# Patient Record
Sex: Male | Born: 1935
Health system: Southern US, Community
[De-identification: ages and names within clinical notes are randomized; demographics above are authoritative.]

## PROBLEM LIST (undated history)

## (undated) DIAGNOSIS — K219 Gastro-esophageal reflux disease without esophagitis: Secondary | ICD-10-CM

## (undated) DIAGNOSIS — N508 Other specified disorders of male genital organs: Secondary | ICD-10-CM

## (undated) DIAGNOSIS — M79609 Pain in unspecified limb: Secondary | ICD-10-CM

## (undated) DIAGNOSIS — I4891 Unspecified atrial fibrillation: Secondary | ICD-10-CM

## (undated) DIAGNOSIS — M549 Dorsalgia, unspecified: Secondary | ICD-10-CM

## (undated) DIAGNOSIS — I2510 Atherosclerotic heart disease of native coronary artery without angina pectoris: Secondary | ICD-10-CM

## (undated) DIAGNOSIS — S52509A Unspecified fracture of the lower end of unspecified radius, initial encounter for closed fracture: Secondary | ICD-10-CM

## (undated) DIAGNOSIS — M1990 Unspecified osteoarthritis, unspecified site: Secondary | ICD-10-CM

## (undated) DIAGNOSIS — L600 Ingrowing nail: Secondary | ICD-10-CM

## (undated) DIAGNOSIS — M25539 Pain in unspecified wrist: Secondary | ICD-10-CM

## (undated) DIAGNOSIS — R5381 Other malaise: Secondary | ICD-10-CM

## (undated) HISTORY — PX: OTHER SURGICAL HISTORY: SHX169

## (undated) HISTORY — DX: Unspecified atrial fibrillation: I48.91

## (undated) HISTORY — DX: Atherosclerotic heart disease of native coronary artery without angina pectoris: I25.10

## (undated) HISTORY — DX: Unspecified osteoarthritis, unspecified site: M19.90

## (undated) HISTORY — PX: POLYPECTOMY: SHX149

## (undated) HISTORY — DX: Dorsalgia, unspecified: M54.9

## (undated) HISTORY — PX: CATARACT EXTRACTION: SUR2

## (undated) HISTORY — DX: Gastro-esophageal reflux disease without esophagitis: K21.9

## (undated) HISTORY — PX: UMBILICAL HERNIA REPAIR: SHX196

## (undated) HISTORY — PX: CARDIAC CATHETERIZATION: SHX172

## (undated) HISTORY — PX: CHOLECYSTECTOMY: SHX55

## (undated) HISTORY — PX: MENISCUS REPAIR: SHX5179

---
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
## 1997-12-25 ENCOUNTER — Ambulatory Visit (HOSPITAL_COMMUNITY): Admission: RE | Admit: 1997-12-25 | Discharge: 1997-12-25 | Payer: Self-pay | Admitting: *Deleted

## 1998-01-14 ENCOUNTER — Other Ambulatory Visit: Admission: RE | Admit: 1998-01-14 | Discharge: 1998-01-14 | Payer: Self-pay | Admitting: Gastroenterology

## 2003-11-21 ENCOUNTER — Ambulatory Visit: Payer: Self-pay | Admitting: Internal Medicine

## 2003-12-11 ENCOUNTER — Ambulatory Visit: Payer: Self-pay | Admitting: Internal Medicine

## 2004-12-17 ENCOUNTER — Ambulatory Visit: Payer: Self-pay | Admitting: Internal Medicine

## 2004-12-22 ENCOUNTER — Ambulatory Visit: Payer: Self-pay | Admitting: Internal Medicine

## 2005-06-09 ENCOUNTER — Ambulatory Visit: Payer: Self-pay | Admitting: Gastroenterology

## 2005-06-17 ENCOUNTER — Ambulatory Visit: Payer: Self-pay | Admitting: Gastroenterology

## 2006-02-10 ENCOUNTER — Ambulatory Visit: Payer: Self-pay | Admitting: Internal Medicine

## 2006-02-10 LAB — CONVERTED CEMR LAB
Alkaline Phosphatase: 126 units/L — ABNORMAL HIGH (ref 39–117)
BUN: 15 mg/dL (ref 6–23)
CO2: 32 meq/L (ref 19–32)
Calcium: 9.5 mg/dL (ref 8.4–10.5)
Chloride: 103 meq/L (ref 96–112)
Cholesterol: 194 mg/dL (ref 0–200)
GFR calc non Af Amer: 70 mL/min
Glucose, Bld: 114 mg/dL — ABNORMAL HIGH (ref 70–99)
HDL: 42.3 mg/dL (ref >39.0)
LDL Cholesterol: 130 mg/dL — ABNORMAL HIGH (ref 0–99)
PSA: 1.34 ng/mL (ref 0.10–4.00)
Potassium: 4.5 meq/L (ref 3.5–5.1)
Total CHOL/HDL Ratio: 4.6
Total Protein: 6.9 g/dL (ref 6.0–8.3)

## 2006-11-10 ENCOUNTER — Encounter: Payer: Self-pay | Admitting: Internal Medicine

## 2006-11-10 ENCOUNTER — Ambulatory Visit: Payer: Self-pay | Admitting: Internal Medicine

## 2006-11-15 ENCOUNTER — Encounter: Payer: Self-pay | Admitting: Internal Medicine

## 2006-11-15 ENCOUNTER — Ambulatory Visit: Payer: Self-pay | Admitting: Internal Medicine

## 2006-11-15 LAB — CONVERTED CEMR LAB
Basophils Relative: 0.2 % (ref 0.0–1.0)
CRP, High Sensitivity: 31.9 — ABNORMAL HIGH
Eosinophils Relative: 1.6 % (ref 0.0–5.0)
Hemoglobin: 13.6 g/dL (ref 13.0–17.0)
Lymphocytes Relative: 33.2 % (ref 12.0–46.0)
Monocytes Relative: 5.6 % (ref 3.0–11.0)
Platelets: 256 10*3/uL (ref 150–400)
RDW: 12.3 % (ref 11.5–14.6)
WBC: 4.6 10*3/uL (ref 4.5–10.5)

## 2006-11-18 ENCOUNTER — Telehealth: Payer: Self-pay | Admitting: Internal Medicine

## 2006-12-16 ENCOUNTER — Encounter: Payer: Self-pay | Admitting: Internal Medicine

## 2006-12-19 ENCOUNTER — Ambulatory Visit: Payer: Self-pay | Admitting: Internal Medicine

## 2006-12-19 DIAGNOSIS — N508 Other specified disorders of male genital organs: Secondary | ICD-10-CM

## 2006-12-19 HISTORY — DX: Other specified disorders of male genital organs: N50.8

## 2006-12-22 ENCOUNTER — Encounter: Payer: Self-pay | Admitting: Internal Medicine

## 2007-09-13 ENCOUNTER — Ambulatory Visit: Payer: Self-pay | Admitting: Internal Medicine

## 2007-09-13 DIAGNOSIS — R5381 Other malaise: Secondary | ICD-10-CM

## 2007-09-13 DIAGNOSIS — R5383 Other fatigue: Secondary | ICD-10-CM

## 2007-09-13 HISTORY — DX: Other malaise: R53.81

## 2007-09-13 LAB — CONVERTED CEMR LAB
BUN: 11 mg/dL (ref 6–23)
Basophils Relative: 0.8 % (ref 0.0–3.0)
CO2: 33 meq/L — ABNORMAL HIGH (ref 19–32)
Chloride: 110 meq/L (ref 96–112)
Creatinine, Ser: 1 mg/dL (ref 0.4–1.5)
Eosinophils Absolute: 0.1 10*3/uL (ref 0.0–0.7)
Eosinophils Relative: 1.4 % (ref 0.0–5.0)
Folate: 19.6 ng/mL
Glucose, Bld: 109 mg/dL — ABNORMAL HIGH (ref 70–99)
HCT: 40.5 % (ref 39.0–52.0)
Hemoglobin: 14.4 g/dL (ref 13.0–17.0)
MCV: 97.5 fL (ref 78.0–100.0)
Monocytes Absolute: 0.2 10*3/uL (ref 0.1–1.0)
Monocytes Relative: 5.3 % (ref 3.0–12.0)
Neutro Abs: 1.9 10*3/uL (ref 1.4–7.7)
PSA: 2.82 ng/mL (ref 0.10–4.00)
RBC: 4.16 M/uL — ABNORMAL LOW (ref 4.22–5.81)
WBC: 3.9 10*3/uL — ABNORMAL LOW (ref 4.5–10.5)

## 2007-10-19 ENCOUNTER — Telehealth: Payer: Self-pay | Admitting: Internal Medicine

## 2007-10-24 ENCOUNTER — Ambulatory Visit: Payer: Self-pay | Admitting: Internal Medicine

## 2009-02-13 ENCOUNTER — Encounter: Payer: Self-pay | Admitting: Internal Medicine

## 2009-02-20 ENCOUNTER — Encounter: Payer: Self-pay | Admitting: Internal Medicine

## 2010-02-17 NOTE — Letter (Signed)
Summary: [facility]  [facility]   Imported By: Sherian Rein 03/04/2009 09:07:35  _____________________________________________________________________  External Attachment:    Type:   Image     Comment:   External Document

## 2010-06-05 NOTE — Assessment & Plan Note (Signed)
[facility]                           PRIMARY CARE OFFICE NOTE   JUPITER, KABIR                  MRN:          [id number]  DATE:02/10/2006                            DOB:          November 23, 1930    Mr. Lenz is a pleasant 77 year old gentleman who presents for  follow-up evaluation and exam.  He was last seen December 22, 2004.  Please see that complete dictation for past medical history and social  history.  Family history is noncontributory and is documented elsewhere.   INTERVAL HISTORY:  The patient reports that he fell off a ladder several  weeks ago after trying to clean the gutters.  He hit hard on his back  and hips.  He was seen by orthopedics several weeks after the fall  because of persistent pain and was diagnosed with a right inferior pubic  ramus fracture. He reports he is doing much better at this time but  still has some discomfort.   The patient continues to have occasional discomfort in the left lower  quadrant of his abdomen.   CURRENT MEDICATIONS:  None except for Viagra on a p.r.n. basis.   CHART REVIEW:  The patient's last colonoscopy Jun 17, 2005, with  external hemorrhoids, otherwise unremarkable.  The patient did have  cardiac catheterization in 1999, which was a study that was normal with  no signs of obstructive coronary disease.  The patient has had abdominal  ultrasound August 29, 2001, which showed cholelithiasis with two hepatic  cysts and otherwise was unremarkable.  The patient has been asymptomatic  and never came to surgery.   REVIEW OF SYSTEMS:  Negative for any constitutional problems.  The  patient has had an eye exam in about the last 12 months.  The patient is  edentulous with dentures with poor fit on the mandible.  CARDIOVASCULAR:  Unremarkable except for rare palpitations.  No respiratory complaints.  GI:  Notable for irregular bowel habit but no bloating, no gas, no  distention.  GU:   Significant for nocturia x1.  MUSCULOSKELETAL:  As  above.   PHYSICAL EXAMINATION:  VITAL SIGNS:  Temperature was 97.4, blood  pressure was 153/69, pulse 56, weight 171.6.  Height is 5 feet 11  inches.  GENERAL APPEARANCE:  This is a slender gentleman looking younger than  his stated chronologic age, in no acute distress.  HEENT:  Normocephalic, atraumatic.  EACs and TMs noted to have mild  cerumen but the TMs were normal.  Oropharynx:  The patient is  edentulous.  No buccal lesions were noted.  Posterior pharynx was clear.  Conjunctivae and sclerae were clear.  PERRLA, EOMI.  Funduscopic exam  was unremarkable.  NECK:  Supple without thyromegaly.  NODES:  No adenopathy was noted in the cervical or supraclavicular  regions.  CHEST:  No CVA tenderness.  LUNGS:  Clear to auscultation and percussion.  CARDIOVASCULAR:  2+ radial pulse, no JVD or carotid bruits.  He had a  quiet precordium and was regular rate and rhythm without murmurs, rubs  or gallops.  ABDOMEN:  Soft, no guarding, no rebound.  No organosplenomegaly  was  noted.  GENITALIA:  Normal male phallus, bilaterally descended testicles.  RECTAL:  Normal sphincter tone was noted.  Prostate was 3+ but smooth  with no nodules.  EXTREMITIES:  Without clubbing, cyanosis, edema or deformity.  He has a  normal gait, can bear weight.  NEUROLOGIC:  Nonfocal.  SKIN:  Clear.   DATA BASE:  A 12-lead electrocardiogram was unremarkable except for RSR  prime pattern suggestive of a mild ventricular conduction delay.  He has  T-wave inversion in V1 and V2, which is unchanged from previous study.  Laboratory revealed a PSA of 1.34.  Electrolytes were normal.  Blood  sugar was 114.  Kidney function normal with a creatinine of 1.1 and a  GFR of 70.  Liver functions were normal.  Cholesterol was 194,  triglycerides 111, HDL was 42.3, LDL was 130.   ASSESSMENT AND PLAN:  1. Orthopedic:  The patient is status post fracture of the inferior       pubic ramus.  He is currently improving and doing well.  2. Health maintenance:  The patient is current and up to date with      colorectal cancer screening.  PSA was normal and appropriate for an      enlarged prostate.  3. The patient's lipids are at the top end of normal with an LDL of      130, the goal would be 130 or less.  The patient is very active, at      this point would not make any changes in his regimen or recommend      medical therapy or treatment.  4. The patient's serum glucose is at the top end of normal at 114.      Would recommend that the patient follow a prudent diet in regard to      sweets and carbohydrates.   In summary, this is a very pleasant gentleman who does seem medically  stable at his time.  He is asked to return to see me in 1 year on an as-  needed basis.     Rosalyn Gess Norins, MD  Electronically Signed    MEN/MedQ  DD: 02/11/2006  DT: 02/11/2006  Job #: [id number]   cc:   Danae Orleans. Senaida Ores

## 2010-08-14 ENCOUNTER — Ambulatory Visit (INDEPENDENT_AMBULATORY_CARE_PROVIDER_SITE_OTHER): Payer: Medicare Other | Admitting: Ophthalmology

## 2010-08-14 DIAGNOSIS — H43819 Vitreous degeneration, unspecified eye: Secondary | ICD-10-CM

## 2010-08-14 DIAGNOSIS — H35039 Hypertensive retinopathy, unspecified eye: Secondary | ICD-10-CM

## 2010-08-14 DIAGNOSIS — H348192 Central retinal vein occlusion, unspecified eye, stable: Secondary | ICD-10-CM

## 2010-08-14 DIAGNOSIS — H2510 Age-related nuclear cataract, unspecified eye: Secondary | ICD-10-CM

## 2010-08-20 ENCOUNTER — Encounter (INDEPENDENT_AMBULATORY_CARE_PROVIDER_SITE_OTHER): Payer: Medicare Other | Admitting: Ophthalmology

## 2010-08-20 DIAGNOSIS — H348192 Central retinal vein occlusion, unspecified eye, stable: Secondary | ICD-10-CM

## 2011-02-25 ENCOUNTER — Ambulatory Visit (INDEPENDENT_AMBULATORY_CARE_PROVIDER_SITE_OTHER): Payer: Medicare Other | Admitting: Ophthalmology

## 2011-02-25 DIAGNOSIS — H2510 Age-related nuclear cataract, unspecified eye: Secondary | ICD-10-CM

## 2011-02-25 DIAGNOSIS — H348192 Central retinal vein occlusion, unspecified eye, stable: Secondary | ICD-10-CM

## 2011-02-25 DIAGNOSIS — I10 Essential (primary) hypertension: Secondary | ICD-10-CM

## 2011-02-25 DIAGNOSIS — H35039 Hypertensive retinopathy, unspecified eye: Secondary | ICD-10-CM

## 2011-02-25 DIAGNOSIS — H43819 Vitreous degeneration, unspecified eye: Secondary | ICD-10-CM

## 2011-02-25 DIAGNOSIS — H47239 Glaucomatous optic atrophy, unspecified eye: Secondary | ICD-10-CM

## 2011-08-26 ENCOUNTER — Ambulatory Visit (INDEPENDENT_AMBULATORY_CARE_PROVIDER_SITE_OTHER): Payer: Medicare Other | Admitting: Ophthalmology

## 2011-08-26 DIAGNOSIS — H43819 Vitreous degeneration, unspecified eye: Secondary | ICD-10-CM

## 2011-08-26 DIAGNOSIS — I10 Essential (primary) hypertension: Secondary | ICD-10-CM

## 2011-08-26 DIAGNOSIS — H35039 Hypertensive retinopathy, unspecified eye: Secondary | ICD-10-CM

## 2011-08-26 DIAGNOSIS — H2510 Age-related nuclear cataract, unspecified eye: Secondary | ICD-10-CM

## 2011-08-26 DIAGNOSIS — H3410 Central retinal artery occlusion, unspecified eye: Secondary | ICD-10-CM

## 2012-02-24 ENCOUNTER — Ambulatory Visit (INDEPENDENT_AMBULATORY_CARE_PROVIDER_SITE_OTHER): Payer: Medicare Other | Admitting: Ophthalmology

## 2012-02-24 DIAGNOSIS — H43819 Vitreous degeneration, unspecified eye: Secondary | ICD-10-CM

## 2012-02-24 DIAGNOSIS — H2510 Age-related nuclear cataract, unspecified eye: Secondary | ICD-10-CM

## 2012-02-24 DIAGNOSIS — H348192 Central retinal vein occlusion, unspecified eye, stable: Secondary | ICD-10-CM

## 2012-11-23 ENCOUNTER — Ambulatory Visit (INDEPENDENT_AMBULATORY_CARE_PROVIDER_SITE_OTHER): Payer: Medicare Other | Admitting: Ophthalmology

## 2012-11-23 DIAGNOSIS — H43819 Vitreous degeneration, unspecified eye: Secondary | ICD-10-CM

## 2012-11-23 DIAGNOSIS — H348192 Central retinal vein occlusion, unspecified eye, stable: Secondary | ICD-10-CM

## 2012-11-23 DIAGNOSIS — H2510 Age-related nuclear cataract, unspecified eye: Secondary | ICD-10-CM

## 2013-08-23 ENCOUNTER — Ambulatory Visit (INDEPENDENT_AMBULATORY_CARE_PROVIDER_SITE_OTHER): Payer: Medicare Other | Admitting: Ophthalmology

## 2013-08-23 DIAGNOSIS — H348192 Central retinal vein occlusion, unspecified eye, stable: Secondary | ICD-10-CM | POA: Diagnosis not present

## 2014-05-01 DIAGNOSIS — Z0000 Encounter for general adult medical examination without abnormal findings: Secondary | ICD-10-CM | POA: Diagnosis not present

## 2014-05-01 DIAGNOSIS — R001 Bradycardia, unspecified: Secondary | ICD-10-CM | POA: Diagnosis not present

## 2014-05-01 DIAGNOSIS — Z1389 Encounter for screening for other disorder: Secondary | ICD-10-CM | POA: Diagnosis not present

## 2014-05-01 DIAGNOSIS — Z6822 Body mass index (BMI) 22.0-22.9, adult: Secondary | ICD-10-CM | POA: Diagnosis not present

## 2014-05-01 DIAGNOSIS — E785 Hyperlipidemia, unspecified: Secondary | ICD-10-CM | POA: Diagnosis not present

## 2014-05-01 DIAGNOSIS — Z125 Encounter for screening for malignant neoplasm of prostate: Secondary | ICD-10-CM | POA: Diagnosis not present

## 2014-05-01 DIAGNOSIS — Z9181 History of falling: Secondary | ICD-10-CM | POA: Diagnosis not present

## 2014-05-29 ENCOUNTER — Ambulatory Visit (INDEPENDENT_AMBULATORY_CARE_PROVIDER_SITE_OTHER): Payer: Medicare Other | Admitting: Ophthalmology

## 2014-05-29 DIAGNOSIS — H43813 Vitreous degeneration, bilateral: Secondary | ICD-10-CM | POA: Diagnosis not present

## 2014-05-29 DIAGNOSIS — H34812 Central retinal vein occlusion, left eye: Secondary | ICD-10-CM | POA: Diagnosis not present

## 2014-10-24 DIAGNOSIS — Z23 Encounter for immunization: Secondary | ICD-10-CM | POA: Diagnosis not present

## 2015-01-21 DIAGNOSIS — Z6822 Body mass index (BMI) 22.0-22.9, adult: Secondary | ICD-10-CM | POA: Diagnosis not present

## 2015-01-21 DIAGNOSIS — Z23 Encounter for immunization: Secondary | ICD-10-CM | POA: Diagnosis not present

## 2015-01-21 DIAGNOSIS — R0781 Pleurodynia: Secondary | ICD-10-CM | POA: Diagnosis not present

## 2015-03-05 ENCOUNTER — Ambulatory Visit (INDEPENDENT_AMBULATORY_CARE_PROVIDER_SITE_OTHER): Payer: Medicare Other | Admitting: Ophthalmology

## 2015-03-05 DIAGNOSIS — H43813 Vitreous degeneration, bilateral: Secondary | ICD-10-CM

## 2015-03-05 DIAGNOSIS — H348122 Central retinal vein occlusion, left eye, stable: Secondary | ICD-10-CM

## 2015-03-05 DIAGNOSIS — H2513 Age-related nuclear cataract, bilateral: Secondary | ICD-10-CM | POA: Diagnosis not present

## 2015-03-05 DIAGNOSIS — H353131 Nonexudative age-related macular degeneration, bilateral, early dry stage: Secondary | ICD-10-CM

## 2015-05-08 DIAGNOSIS — Z1389 Encounter for screening for other disorder: Secondary | ICD-10-CM | POA: Diagnosis not present

## 2015-05-08 DIAGNOSIS — Z0000 Encounter for general adult medical examination without abnormal findings: Secondary | ICD-10-CM | POA: Diagnosis not present

## 2015-05-08 DIAGNOSIS — Z125 Encounter for screening for malignant neoplasm of prostate: Secondary | ICD-10-CM | POA: Diagnosis not present

## 2015-05-08 DIAGNOSIS — Z9181 History of falling: Secondary | ICD-10-CM | POA: Diagnosis not present

## 2015-05-08 DIAGNOSIS — R001 Bradycardia, unspecified: Secondary | ICD-10-CM | POA: Diagnosis not present

## 2015-05-08 DIAGNOSIS — E785 Hyperlipidemia, unspecified: Secondary | ICD-10-CM | POA: Diagnosis not present

## 2015-05-14 DIAGNOSIS — L720 Epidermal cyst: Secondary | ICD-10-CM | POA: Diagnosis not present

## 2015-05-14 DIAGNOSIS — L821 Other seborrheic keratosis: Secondary | ICD-10-CM | POA: Diagnosis not present

## 2015-05-14 DIAGNOSIS — C44311 Basal cell carcinoma of skin of nose: Secondary | ICD-10-CM | POA: Diagnosis not present

## 2015-06-04 DIAGNOSIS — C44311 Basal cell carcinoma of skin of nose: Secondary | ICD-10-CM | POA: Diagnosis not present

## 2015-07-24 DIAGNOSIS — M79609 Pain in unspecified limb: Secondary | ICD-10-CM | POA: Insufficient documentation

## 2015-07-24 DIAGNOSIS — L600 Ingrowing nail: Secondary | ICD-10-CM

## 2015-07-24 DIAGNOSIS — M79674 Pain in right toe(s): Secondary | ICD-10-CM | POA: Diagnosis not present

## 2015-07-24 HISTORY — DX: Pain in unspecified limb: M79.609

## 2015-07-24 HISTORY — DX: Ingrowing nail: L60.0

## 2015-08-08 DIAGNOSIS — H25813 Combined forms of age-related cataract, bilateral: Secondary | ICD-10-CM | POA: Diagnosis not present

## 2015-09-19 DIAGNOSIS — L578 Other skin changes due to chronic exposure to nonionizing radiation: Secondary | ICD-10-CM | POA: Diagnosis not present

## 2015-09-19 DIAGNOSIS — C44311 Basal cell carcinoma of skin of nose: Secondary | ICD-10-CM | POA: Diagnosis not present

## 2015-09-19 DIAGNOSIS — L821 Other seborrheic keratosis: Secondary | ICD-10-CM | POA: Diagnosis not present

## 2015-09-19 DIAGNOSIS — L820 Inflamed seborrheic keratosis: Secondary | ICD-10-CM | POA: Diagnosis not present

## 2015-09-19 DIAGNOSIS — D485 Neoplasm of uncertain behavior of skin: Secondary | ICD-10-CM | POA: Diagnosis not present

## 2015-11-07 DIAGNOSIS — Z23 Encounter for immunization: Secondary | ICD-10-CM | POA: Diagnosis not present

## 2015-12-03 ENCOUNTER — Ambulatory Visit (INDEPENDENT_AMBULATORY_CARE_PROVIDER_SITE_OTHER): Payer: Medicare Other | Admitting: Ophthalmology

## 2015-12-03 DIAGNOSIS — H353112 Nonexudative age-related macular degeneration, right eye, intermediate dry stage: Secondary | ICD-10-CM | POA: Diagnosis not present

## 2015-12-03 DIAGNOSIS — H34812 Central retinal vein occlusion, left eye, with macular edema: Secondary | ICD-10-CM | POA: Diagnosis not present

## 2015-12-03 DIAGNOSIS — H2513 Age-related nuclear cataract, bilateral: Secondary | ICD-10-CM | POA: Diagnosis not present

## 2015-12-03 DIAGNOSIS — H35033 Hypertensive retinopathy, bilateral: Secondary | ICD-10-CM | POA: Diagnosis not present

## 2015-12-03 DIAGNOSIS — I10 Essential (primary) hypertension: Secondary | ICD-10-CM | POA: Diagnosis not present

## 2015-12-03 DIAGNOSIS — H43813 Vitreous degeneration, bilateral: Secondary | ICD-10-CM | POA: Diagnosis not present

## 2015-12-03 DIAGNOSIS — H353121 Nonexudative age-related macular degeneration, left eye, early dry stage: Secondary | ICD-10-CM

## 2016-05-13 DIAGNOSIS — R7301 Impaired fasting glucose: Secondary | ICD-10-CM | POA: Diagnosis not present

## 2016-05-13 DIAGNOSIS — E785 Hyperlipidemia, unspecified: Secondary | ICD-10-CM | POA: Diagnosis not present

## 2016-05-13 DIAGNOSIS — Z125 Encounter for screening for malignant neoplasm of prostate: Secondary | ICD-10-CM | POA: Diagnosis not present

## 2016-05-13 DIAGNOSIS — Z1389 Encounter for screening for other disorder: Secondary | ICD-10-CM | POA: Diagnosis not present

## 2016-05-13 DIAGNOSIS — Z139 Encounter for screening, unspecified: Secondary | ICD-10-CM | POA: Diagnosis not present

## 2016-05-13 DIAGNOSIS — Z0000 Encounter for general adult medical examination without abnormal findings: Secondary | ICD-10-CM | POA: Diagnosis not present

## 2016-05-13 DIAGNOSIS — Z23 Encounter for immunization: Secondary | ICD-10-CM | POA: Diagnosis not present

## 2016-05-13 DIAGNOSIS — R001 Bradycardia, unspecified: Secondary | ICD-10-CM | POA: Diagnosis not present

## 2016-05-13 DIAGNOSIS — Z9181 History of falling: Secondary | ICD-10-CM | POA: Diagnosis not present

## 2016-07-08 DIAGNOSIS — Z125 Encounter for screening for malignant neoplasm of prostate: Secondary | ICD-10-CM | POA: Diagnosis not present

## 2016-07-08 DIAGNOSIS — E785 Hyperlipidemia, unspecified: Secondary | ICD-10-CM | POA: Diagnosis not present

## 2016-07-08 DIAGNOSIS — Z9181 History of falling: Secondary | ICD-10-CM | POA: Diagnosis not present

## 2016-07-08 DIAGNOSIS — Z139 Encounter for screening, unspecified: Secondary | ICD-10-CM | POA: Diagnosis not present

## 2016-07-08 DIAGNOSIS — Z0000 Encounter for general adult medical examination without abnormal findings: Secondary | ICD-10-CM | POA: Diagnosis not present

## 2016-07-08 DIAGNOSIS — Z1389 Encounter for screening for other disorder: Secondary | ICD-10-CM | POA: Diagnosis not present

## 2016-09-01 ENCOUNTER — Ambulatory Visit (INDEPENDENT_AMBULATORY_CARE_PROVIDER_SITE_OTHER): Payer: Medicare Other | Admitting: Ophthalmology

## 2016-09-01 DIAGNOSIS — H353132 Nonexudative age-related macular degeneration, bilateral, intermediate dry stage: Secondary | ICD-10-CM

## 2016-09-01 DIAGNOSIS — H348122 Central retinal vein occlusion, left eye, stable: Secondary | ICD-10-CM

## 2016-09-01 DIAGNOSIS — H43813 Vitreous degeneration, bilateral: Secondary | ICD-10-CM

## 2016-09-01 DIAGNOSIS — H2513 Age-related nuclear cataract, bilateral: Secondary | ICD-10-CM | POA: Diagnosis not present

## 2016-09-01 DIAGNOSIS — I10 Essential (primary) hypertension: Secondary | ICD-10-CM | POA: Diagnosis not present

## 2016-09-01 DIAGNOSIS — H35033 Hypertensive retinopathy, bilateral: Secondary | ICD-10-CM

## 2016-09-09 DIAGNOSIS — H25813 Combined forms of age-related cataract, bilateral: Secondary | ICD-10-CM | POA: Diagnosis not present

## 2016-09-24 DIAGNOSIS — H348122 Central retinal vein occlusion, left eye, stable: Secondary | ICD-10-CM | POA: Diagnosis not present

## 2016-09-24 DIAGNOSIS — H2513 Age-related nuclear cataract, bilateral: Secondary | ICD-10-CM | POA: Diagnosis not present

## 2016-09-24 DIAGNOSIS — H353131 Nonexudative age-related macular degeneration, bilateral, early dry stage: Secondary | ICD-10-CM | POA: Diagnosis not present

## 2016-10-11 DIAGNOSIS — H2512 Age-related nuclear cataract, left eye: Secondary | ICD-10-CM | POA: Diagnosis not present

## 2016-10-11 DIAGNOSIS — H2511 Age-related nuclear cataract, right eye: Secondary | ICD-10-CM | POA: Diagnosis not present

## 2016-10-11 DIAGNOSIS — H25811 Combined forms of age-related cataract, right eye: Secondary | ICD-10-CM | POA: Diagnosis not present

## 2016-10-16 DIAGNOSIS — H2512 Age-related nuclear cataract, left eye: Secondary | ICD-10-CM | POA: Diagnosis not present

## 2016-10-20 DIAGNOSIS — L578 Other skin changes due to chronic exposure to nonionizing radiation: Secondary | ICD-10-CM | POA: Diagnosis not present

## 2016-10-20 DIAGNOSIS — C44311 Basal cell carcinoma of skin of nose: Secondary | ICD-10-CM | POA: Diagnosis not present

## 2016-10-20 DIAGNOSIS — L821 Other seborrheic keratosis: Secondary | ICD-10-CM | POA: Diagnosis not present

## 2016-10-20 DIAGNOSIS — B351 Tinea unguium: Secondary | ICD-10-CM | POA: Diagnosis not present

## 2016-10-25 DIAGNOSIS — H2512 Age-related nuclear cataract, left eye: Secondary | ICD-10-CM | POA: Diagnosis not present

## 2016-10-26 DIAGNOSIS — H25812 Combined forms of age-related cataract, left eye: Secondary | ICD-10-CM | POA: Diagnosis not present

## 2016-11-05 DIAGNOSIS — Z23 Encounter for immunization: Secondary | ICD-10-CM | POA: Diagnosis not present

## 2016-11-25 DIAGNOSIS — H25812 Combined forms of age-related cataract, left eye: Secondary | ICD-10-CM | POA: Diagnosis not present

## 2016-12-17 ENCOUNTER — Encounter (INDEPENDENT_AMBULATORY_CARE_PROVIDER_SITE_OTHER): Payer: Medicare Other | Admitting: Ophthalmology

## 2016-12-17 DIAGNOSIS — H34812 Central retinal vein occlusion, left eye, with macular edema: Secondary | ICD-10-CM

## 2016-12-17 DIAGNOSIS — H43813 Vitreous degeneration, bilateral: Secondary | ICD-10-CM | POA: Diagnosis not present

## 2016-12-17 DIAGNOSIS — H35033 Hypertensive retinopathy, bilateral: Secondary | ICD-10-CM

## 2016-12-17 DIAGNOSIS — H353132 Nonexudative age-related macular degeneration, bilateral, intermediate dry stage: Secondary | ICD-10-CM

## 2016-12-17 DIAGNOSIS — I10 Essential (primary) hypertension: Secondary | ICD-10-CM

## 2016-12-28 DIAGNOSIS — S52501A Unspecified fracture of the lower end of right radius, initial encounter for closed fracture: Secondary | ICD-10-CM | POA: Diagnosis not present

## 2017-01-05 DIAGNOSIS — S52531D Colles' fracture of right radius, subsequent encounter for closed fracture with routine healing: Secondary | ICD-10-CM | POA: Diagnosis not present

## 2017-01-13 DIAGNOSIS — S52531D Colles' fracture of right radius, subsequent encounter for closed fracture with routine healing: Secondary | ICD-10-CM | POA: Diagnosis not present

## 2017-02-09 DIAGNOSIS — S52531D Colles' fracture of right radius, subsequent encounter for closed fracture with routine healing: Secondary | ICD-10-CM | POA: Diagnosis not present

## 2017-03-02 DIAGNOSIS — S52531D Colles' fracture of right radius, subsequent encounter for closed fracture with routine healing: Secondary | ICD-10-CM | POA: Diagnosis not present

## 2017-03-09 ENCOUNTER — Encounter (INDEPENDENT_AMBULATORY_CARE_PROVIDER_SITE_OTHER): Payer: Medicare Other | Admitting: Ophthalmology

## 2017-03-09 DIAGNOSIS — H34812 Central retinal vein occlusion, left eye, with macular edema: Secondary | ICD-10-CM | POA: Diagnosis not present

## 2017-03-09 DIAGNOSIS — H353132 Nonexudative age-related macular degeneration, bilateral, intermediate dry stage: Secondary | ICD-10-CM | POA: Diagnosis not present

## 2017-03-09 DIAGNOSIS — H43813 Vitreous degeneration, bilateral: Secondary | ICD-10-CM | POA: Diagnosis not present

## 2017-03-09 DIAGNOSIS — I10 Essential (primary) hypertension: Secondary | ICD-10-CM

## 2017-03-09 DIAGNOSIS — H35033 Hypertensive retinopathy, bilateral: Secondary | ICD-10-CM

## 2017-03-23 DIAGNOSIS — S52531D Colles' fracture of right radius, subsequent encounter for closed fracture with routine healing: Secondary | ICD-10-CM | POA: Diagnosis not present

## 2017-03-30 DIAGNOSIS — S52531D Colles' fracture of right radius, subsequent encounter for closed fracture with routine healing: Secondary | ICD-10-CM | POA: Diagnosis not present

## 2017-03-30 DIAGNOSIS — M6281 Muscle weakness (generalized): Secondary | ICD-10-CM | POA: Diagnosis not present

## 2017-03-30 DIAGNOSIS — M25631 Stiffness of right wrist, not elsewhere classified: Secondary | ICD-10-CM | POA: Diagnosis not present

## 2017-03-30 DIAGNOSIS — M25531 Pain in right wrist: Secondary | ICD-10-CM | POA: Diagnosis not present

## 2017-04-01 DIAGNOSIS — M25631 Stiffness of right wrist, not elsewhere classified: Secondary | ICD-10-CM | POA: Diagnosis not present

## 2017-04-01 DIAGNOSIS — M25531 Pain in right wrist: Secondary | ICD-10-CM | POA: Diagnosis not present

## 2017-04-01 DIAGNOSIS — M6281 Muscle weakness (generalized): Secondary | ICD-10-CM | POA: Diagnosis not present

## 2017-04-01 DIAGNOSIS — S52531D Colles' fracture of right radius, subsequent encounter for closed fracture with routine healing: Secondary | ICD-10-CM | POA: Diagnosis not present

## 2017-04-07 DIAGNOSIS — M25631 Stiffness of right wrist, not elsewhere classified: Secondary | ICD-10-CM | POA: Diagnosis not present

## 2017-04-07 DIAGNOSIS — M6281 Muscle weakness (generalized): Secondary | ICD-10-CM | POA: Diagnosis not present

## 2017-04-07 DIAGNOSIS — S52531D Colles' fracture of right radius, subsequent encounter for closed fracture with routine healing: Secondary | ICD-10-CM | POA: Diagnosis not present

## 2017-04-07 DIAGNOSIS — M25531 Pain in right wrist: Secondary | ICD-10-CM | POA: Diagnosis not present

## 2017-04-13 DIAGNOSIS — M25531 Pain in right wrist: Secondary | ICD-10-CM | POA: Diagnosis not present

## 2017-04-13 DIAGNOSIS — M25631 Stiffness of right wrist, not elsewhere classified: Secondary | ICD-10-CM | POA: Diagnosis not present

## 2017-04-13 DIAGNOSIS — M6281 Muscle weakness (generalized): Secondary | ICD-10-CM | POA: Diagnosis not present

## 2017-04-13 DIAGNOSIS — S52531D Colles' fracture of right radius, subsequent encounter for closed fracture with routine healing: Secondary | ICD-10-CM | POA: Diagnosis not present

## 2017-04-20 DIAGNOSIS — S52531D Colles' fracture of right radius, subsequent encounter for closed fracture with routine healing: Secondary | ICD-10-CM | POA: Diagnosis not present

## 2017-05-18 DIAGNOSIS — R7301 Impaired fasting glucose: Secondary | ICD-10-CM | POA: Diagnosis not present

## 2017-05-18 DIAGNOSIS — R001 Bradycardia, unspecified: Secondary | ICD-10-CM | POA: Diagnosis not present

## 2017-05-18 DIAGNOSIS — Z1331 Encounter for screening for depression: Secondary | ICD-10-CM | POA: Diagnosis not present

## 2017-05-18 DIAGNOSIS — Z0000 Encounter for general adult medical examination without abnormal findings: Secondary | ICD-10-CM | POA: Diagnosis not present

## 2017-05-18 DIAGNOSIS — Z125 Encounter for screening for malignant neoplasm of prostate: Secondary | ICD-10-CM | POA: Diagnosis not present

## 2017-05-18 DIAGNOSIS — E785 Hyperlipidemia, unspecified: Secondary | ICD-10-CM | POA: Diagnosis not present

## 2017-06-08 ENCOUNTER — Encounter (INDEPENDENT_AMBULATORY_CARE_PROVIDER_SITE_OTHER): Payer: Medicare Other | Admitting: Ophthalmology

## 2017-06-08 DIAGNOSIS — H353132 Nonexudative age-related macular degeneration, bilateral, intermediate dry stage: Secondary | ICD-10-CM

## 2017-06-08 DIAGNOSIS — I10 Essential (primary) hypertension: Secondary | ICD-10-CM

## 2017-06-08 DIAGNOSIS — H43813 Vitreous degeneration, bilateral: Secondary | ICD-10-CM | POA: Diagnosis not present

## 2017-06-08 DIAGNOSIS — H34812 Central retinal vein occlusion, left eye, with macular edema: Secondary | ICD-10-CM

## 2017-06-08 DIAGNOSIS — H35033 Hypertensive retinopathy, bilateral: Secondary | ICD-10-CM

## 2017-07-28 DIAGNOSIS — L600 Ingrowing nail: Secondary | ICD-10-CM | POA: Diagnosis not present

## 2017-09-07 ENCOUNTER — Encounter (INDEPENDENT_AMBULATORY_CARE_PROVIDER_SITE_OTHER): Payer: Self-pay | Admitting: Ophthalmology

## 2017-09-07 ENCOUNTER — Encounter (INDEPENDENT_AMBULATORY_CARE_PROVIDER_SITE_OTHER): Payer: Medicare Other | Admitting: Ophthalmology

## 2017-09-07 DIAGNOSIS — I10 Essential (primary) hypertension: Secondary | ICD-10-CM | POA: Diagnosis not present

## 2017-09-07 DIAGNOSIS — H353132 Nonexudative age-related macular degeneration, bilateral, intermediate dry stage: Secondary | ICD-10-CM

## 2017-09-07 DIAGNOSIS — H43813 Vitreous degeneration, bilateral: Secondary | ICD-10-CM | POA: Diagnosis not present

## 2017-09-07 DIAGNOSIS — H35033 Hypertensive retinopathy, bilateral: Secondary | ICD-10-CM | POA: Diagnosis not present

## 2017-09-07 DIAGNOSIS — H34812 Central retinal vein occlusion, left eye, with macular edema: Secondary | ICD-10-CM

## 2017-09-21 DIAGNOSIS — K5909 Other constipation: Secondary | ICD-10-CM | POA: Diagnosis not present

## 2017-09-28 DIAGNOSIS — M19231 Secondary osteoarthritis, right wrist: Secondary | ICD-10-CM | POA: Diagnosis not present

## 2017-09-28 DIAGNOSIS — S52531D Colles' fracture of right radius, subsequent encounter for closed fracture with routine healing: Secondary | ICD-10-CM | POA: Diagnosis not present

## 2017-10-06 DIAGNOSIS — K5900 Constipation, unspecified: Secondary | ICD-10-CM | POA: Diagnosis not present

## 2017-10-13 DIAGNOSIS — M19031 Primary osteoarthritis, right wrist: Secondary | ICD-10-CM | POA: Diagnosis not present

## 2017-10-13 DIAGNOSIS — M25531 Pain in right wrist: Secondary | ICD-10-CM | POA: Diagnosis not present

## 2017-10-13 DIAGNOSIS — S6991XA Unspecified injury of right wrist, hand and finger(s), initial encounter: Secondary | ICD-10-CM | POA: Diagnosis not present

## 2017-10-19 DIAGNOSIS — M25831 Other specified joint disorders, right wrist: Secondary | ICD-10-CM | POA: Diagnosis not present

## 2017-10-19 DIAGNOSIS — S52571P Other intraarticular fracture of lower end of right radius, subsequent encounter for closed fracture with malunion: Secondary | ICD-10-CM | POA: Diagnosis not present

## 2017-10-26 DIAGNOSIS — Z23 Encounter for immunization: Secondary | ICD-10-CM | POA: Diagnosis not present

## 2017-11-03 DIAGNOSIS — K5900 Constipation, unspecified: Secondary | ICD-10-CM | POA: Diagnosis not present

## 2017-11-12 DIAGNOSIS — S52509A Unspecified fracture of the lower end of unspecified radius, initial encounter for closed fracture: Secondary | ICD-10-CM

## 2017-11-12 DIAGNOSIS — S52501P Unspecified fracture of the lower end of right radius, subsequent encounter for closed fracture with malunion: Secondary | ICD-10-CM | POA: Diagnosis not present

## 2017-11-12 DIAGNOSIS — M25531 Pain in right wrist: Secondary | ICD-10-CM | POA: Diagnosis not present

## 2017-11-12 HISTORY — DX: Unspecified fracture of the lower end of unspecified radius, initial encounter for closed fracture: S52.509A

## 2017-12-07 ENCOUNTER — Encounter (INDEPENDENT_AMBULATORY_CARE_PROVIDER_SITE_OTHER): Payer: Medicare Other | Admitting: Ophthalmology

## 2017-12-07 DIAGNOSIS — H43813 Vitreous degeneration, bilateral: Secondary | ICD-10-CM

## 2017-12-07 DIAGNOSIS — H35033 Hypertensive retinopathy, bilateral: Secondary | ICD-10-CM

## 2017-12-07 DIAGNOSIS — I10 Essential (primary) hypertension: Secondary | ICD-10-CM

## 2017-12-07 DIAGNOSIS — H353132 Nonexudative age-related macular degeneration, bilateral, intermediate dry stage: Secondary | ICD-10-CM

## 2017-12-07 DIAGNOSIS — H34812 Central retinal vein occlusion, left eye, with macular edema: Secondary | ICD-10-CM | POA: Diagnosis not present

## 2017-12-13 DIAGNOSIS — K449 Diaphragmatic hernia without obstruction or gangrene: Secondary | ICD-10-CM | POA: Diagnosis not present

## 2017-12-13 DIAGNOSIS — D128 Benign neoplasm of rectum: Secondary | ICD-10-CM | POA: Diagnosis not present

## 2017-12-13 DIAGNOSIS — K2970 Gastritis, unspecified, without bleeding: Secondary | ICD-10-CM | POA: Diagnosis not present

## 2017-12-13 DIAGNOSIS — D122 Benign neoplasm of ascending colon: Secondary | ICD-10-CM | POA: Diagnosis not present

## 2017-12-13 DIAGNOSIS — K210 Gastro-esophageal reflux disease with esophagitis: Secondary | ICD-10-CM | POA: Diagnosis not present

## 2017-12-13 DIAGNOSIS — K222 Esophageal obstruction: Secondary | ICD-10-CM | POA: Diagnosis not present

## 2017-12-13 DIAGNOSIS — Z1211 Encounter for screening for malignant neoplasm of colon: Secondary | ICD-10-CM | POA: Diagnosis not present

## 2017-12-13 DIAGNOSIS — K5730 Diverticulosis of large intestine without perforation or abscess without bleeding: Secondary | ICD-10-CM | POA: Diagnosis not present

## 2017-12-13 DIAGNOSIS — D126 Benign neoplasm of colon, unspecified: Secondary | ICD-10-CM | POA: Diagnosis not present

## 2017-12-13 DIAGNOSIS — Z87891 Personal history of nicotine dependence: Secondary | ICD-10-CM | POA: Diagnosis not present

## 2017-12-13 DIAGNOSIS — K219 Gastro-esophageal reflux disease without esophagitis: Secondary | ICD-10-CM | POA: Diagnosis not present

## 2017-12-13 DIAGNOSIS — K621 Rectal polyp: Secondary | ICD-10-CM | POA: Diagnosis not present

## 2018-01-12 DIAGNOSIS — K219 Gastro-esophageal reflux disease without esophagitis: Secondary | ICD-10-CM | POA: Diagnosis not present

## 2018-02-15 ENCOUNTER — Encounter (INDEPENDENT_AMBULATORY_CARE_PROVIDER_SITE_OTHER): Payer: Medicare Other | Admitting: Ophthalmology

## 2018-02-16 ENCOUNTER — Encounter (INDEPENDENT_AMBULATORY_CARE_PROVIDER_SITE_OTHER): Payer: Medicare Other | Admitting: Ophthalmology

## 2018-02-16 DIAGNOSIS — H353132 Nonexudative age-related macular degeneration, bilateral, intermediate dry stage: Secondary | ICD-10-CM

## 2018-02-16 DIAGNOSIS — H43813 Vitreous degeneration, bilateral: Secondary | ICD-10-CM | POA: Diagnosis not present

## 2018-02-16 DIAGNOSIS — I10 Essential (primary) hypertension: Secondary | ICD-10-CM | POA: Diagnosis not present

## 2018-02-16 DIAGNOSIS — H34812 Central retinal vein occlusion, left eye, with macular edema: Secondary | ICD-10-CM

## 2018-02-16 DIAGNOSIS — H35033 Hypertensive retinopathy, bilateral: Secondary | ICD-10-CM

## 2018-03-02 DIAGNOSIS — R197 Diarrhea, unspecified: Secondary | ICD-10-CM | POA: Diagnosis not present

## 2018-03-30 DIAGNOSIS — L720 Epidermal cyst: Secondary | ICD-10-CM | POA: Diagnosis not present

## 2018-03-30 DIAGNOSIS — L821 Other seborrheic keratosis: Secondary | ICD-10-CM | POA: Diagnosis not present

## 2018-03-30 DIAGNOSIS — L578 Other skin changes due to chronic exposure to nonionizing radiation: Secondary | ICD-10-CM | POA: Diagnosis not present

## 2018-04-20 ENCOUNTER — Other Ambulatory Visit: Payer: Self-pay

## 2018-04-20 ENCOUNTER — Encounter (INDEPENDENT_AMBULATORY_CARE_PROVIDER_SITE_OTHER): Payer: Medicare Other | Admitting: Ophthalmology

## 2018-04-20 DIAGNOSIS — H353132 Nonexudative age-related macular degeneration, bilateral, intermediate dry stage: Secondary | ICD-10-CM

## 2018-04-20 DIAGNOSIS — I10 Essential (primary) hypertension: Secondary | ICD-10-CM | POA: Diagnosis not present

## 2018-04-20 DIAGNOSIS — H34812 Central retinal vein occlusion, left eye, with macular edema: Secondary | ICD-10-CM | POA: Diagnosis not present

## 2018-04-20 DIAGNOSIS — H43813 Vitreous degeneration, bilateral: Secondary | ICD-10-CM | POA: Diagnosis not present

## 2018-04-20 DIAGNOSIS — H35033 Hypertensive retinopathy, bilateral: Secondary | ICD-10-CM

## 2018-05-18 DIAGNOSIS — Z0000 Encounter for general adult medical examination without abnormal findings: Secondary | ICD-10-CM | POA: Diagnosis not present

## 2018-05-18 DIAGNOSIS — Z9181 History of falling: Secondary | ICD-10-CM | POA: Diagnosis not present

## 2018-05-18 DIAGNOSIS — E785 Hyperlipidemia, unspecified: Secondary | ICD-10-CM | POA: Diagnosis not present

## 2018-05-25 DIAGNOSIS — L570 Actinic keratosis: Secondary | ICD-10-CM | POA: Diagnosis not present

## 2018-06-01 DIAGNOSIS — Z139 Encounter for screening, unspecified: Secondary | ICD-10-CM | POA: Diagnosis not present

## 2018-06-01 DIAGNOSIS — E785 Hyperlipidemia, unspecified: Secondary | ICD-10-CM | POA: Diagnosis not present

## 2018-06-01 DIAGNOSIS — R001 Bradycardia, unspecified: Secondary | ICD-10-CM | POA: Diagnosis not present

## 2018-06-01 DIAGNOSIS — R21 Rash and other nonspecific skin eruption: Secondary | ICD-10-CM | POA: Diagnosis not present

## 2018-06-01 DIAGNOSIS — R7301 Impaired fasting glucose: Secondary | ICD-10-CM | POA: Diagnosis not present

## 2018-06-22 ENCOUNTER — Encounter (INDEPENDENT_AMBULATORY_CARE_PROVIDER_SITE_OTHER): Payer: Medicare Other | Admitting: Ophthalmology

## 2018-06-22 ENCOUNTER — Other Ambulatory Visit: Payer: Self-pay

## 2018-06-22 DIAGNOSIS — H35033 Hypertensive retinopathy, bilateral: Secondary | ICD-10-CM

## 2018-06-22 DIAGNOSIS — H43813 Vitreous degeneration, bilateral: Secondary | ICD-10-CM

## 2018-06-22 DIAGNOSIS — H353132 Nonexudative age-related macular degeneration, bilateral, intermediate dry stage: Secondary | ICD-10-CM

## 2018-06-22 DIAGNOSIS — I10 Essential (primary) hypertension: Secondary | ICD-10-CM

## 2018-06-22 DIAGNOSIS — H34812 Central retinal vein occlusion, left eye, with macular edema: Secondary | ICD-10-CM

## 2018-08-02 DIAGNOSIS — Z79899 Other long term (current) drug therapy: Secondary | ICD-10-CM | POA: Diagnosis not present

## 2018-08-02 DIAGNOSIS — I499 Cardiac arrhythmia, unspecified: Secondary | ICD-10-CM | POA: Diagnosis not present

## 2018-08-02 DIAGNOSIS — I340 Nonrheumatic mitral (valve) insufficiency: Secondary | ICD-10-CM | POA: Diagnosis not present

## 2018-08-02 DIAGNOSIS — R079 Chest pain, unspecified: Secondary | ICD-10-CM | POA: Diagnosis not present

## 2018-08-02 DIAGNOSIS — R002 Palpitations: Secondary | ICD-10-CM | POA: Diagnosis not present

## 2018-08-02 DIAGNOSIS — I361 Nonrheumatic tricuspid (valve) insufficiency: Secondary | ICD-10-CM | POA: Diagnosis not present

## 2018-08-02 DIAGNOSIS — K219 Gastro-esophageal reflux disease without esophagitis: Secondary | ICD-10-CM | POA: Diagnosis not present

## 2018-08-02 DIAGNOSIS — I4891 Unspecified atrial fibrillation: Secondary | ICD-10-CM | POA: Diagnosis not present

## 2018-08-02 DIAGNOSIS — M1990 Unspecified osteoarthritis, unspecified site: Secondary | ICD-10-CM | POA: Diagnosis not present

## 2018-08-03 DIAGNOSIS — I4891 Unspecified atrial fibrillation: Secondary | ICD-10-CM | POA: Diagnosis not present

## 2018-08-03 DIAGNOSIS — K219 Gastro-esophageal reflux disease without esophagitis: Secondary | ICD-10-CM | POA: Diagnosis not present

## 2018-08-03 DIAGNOSIS — R079 Chest pain, unspecified: Secondary | ICD-10-CM | POA: Diagnosis not present

## 2018-08-09 DIAGNOSIS — K219 Gastro-esophageal reflux disease without esophagitis: Secondary | ICD-10-CM | POA: Diagnosis not present

## 2018-08-09 DIAGNOSIS — I480 Paroxysmal atrial fibrillation: Secondary | ICD-10-CM | POA: Diagnosis not present

## 2018-08-21 ENCOUNTER — Encounter: Payer: Self-pay | Admitting: Cardiology

## 2018-08-21 ENCOUNTER — Ambulatory Visit (INDEPENDENT_AMBULATORY_CARE_PROVIDER_SITE_OTHER): Payer: Medicare Other | Admitting: Cardiology

## 2018-08-21 ENCOUNTER — Encounter: Payer: Self-pay | Admitting: *Deleted

## 2018-08-21 ENCOUNTER — Other Ambulatory Visit: Payer: Self-pay

## 2018-08-21 VITALS — BP 130/68 | HR 67 | Temp 98.2°F | Ht 72.0 in | Wt 169.0 lb

## 2018-08-21 DIAGNOSIS — M1990 Unspecified osteoarthritis, unspecified site: Secondary | ICD-10-CM

## 2018-08-21 DIAGNOSIS — K219 Gastro-esophageal reflux disease without esophagitis: Secondary | ICD-10-CM | POA: Diagnosis not present

## 2018-08-21 DIAGNOSIS — I2510 Atherosclerotic heart disease of native coronary artery without angina pectoris: Secondary | ICD-10-CM | POA: Diagnosis not present

## 2018-08-21 DIAGNOSIS — I4891 Unspecified atrial fibrillation: Secondary | ICD-10-CM

## 2018-08-21 DIAGNOSIS — M549 Dorsalgia, unspecified: Secondary | ICD-10-CM | POA: Insufficient documentation

## 2018-08-21 DIAGNOSIS — M25539 Pain in unspecified wrist: Secondary | ICD-10-CM

## 2018-08-21 HISTORY — DX: Pain in unspecified wrist: M25.539

## 2018-08-21 LAB — CBC
Hematocrit: 41.1 % (ref 37.5–51.0)
Hemoglobin: 13.6 g/dL (ref 13.0–17.7)
MCH: 33.3 pg — ABNORMAL HIGH (ref 26.6–33.0)
MCHC: 33.1 g/dL (ref 31.5–35.7)
MCV: 101 fL — ABNORMAL HIGH (ref 79–97)
Platelets: 197 10*3/uL (ref 150–450)
RBC: 4.08 x10E6/uL — ABNORMAL LOW (ref 4.14–5.80)
RDW: 13.4 % (ref 11.6–15.4)
WBC: 4.5 10*3/uL (ref 3.4–10.8)

## 2018-08-21 MED ORDER — DILTIAZEM HCL 30 MG PO TABS: 30.0000 mg | ORAL_TABLET | Freq: Two times a day (BID) | ORAL | 1 refills | Status: DC

## 2018-08-21 NOTE — Progress Notes (Signed)
Cardiology Office Note:    Date:  08/22/2018   ID:  Jason Mcconnell, DOB 01/06/1931, MRN [id number]  PCP:  Nicoletta Dress, MD  Cardiologist:  Shirlee More, MD   Referring MD: Nicoletta Dress, MD  ASSESSMENT:    1. Atrial fibrillation, unspecified type ([place])   2. Coronary artery calcification seen on CT scan   3. Gastroesophageal reflux disease without esophagitis   4. Arthritis    PLAN:    In order of problems listed above:  1. He presents with a single episode of paroxysmal atrial fibrillation.  I would put him on suppressive therapy and has not put him on a standing beta-blocker calcium channel blocker with resting heart rate in the range of 60 he will continue his anticoagulant and have a prescription for Cardizem to take PRN for rapid heart rhythm.  Apply a 7-day ZIO monitor if he is having frequent episodes of atrial fibrillation would benefit from an antiarrhythmic drug.  With the drop in hemoglobin in the hospital we will check a CBC today. 2. He is having no symptoms or signs of ischemia at this time I would not pursue an ischemia evaluation 3. Stable continue his PPI with anticoagulant therapy  Next appointment 6 weeks to review ZIO monitor   Medication Adjustments/Labs and Tests Ordered: Current medicines are reviewed at length with the patient today.  Concerns regarding medicines are outlined above.  Orders Placed This Encounter  Procedures  . CBC  . LONG TERM MONITOR (3-14 DAYS)  . EKG 12-Lead   Meds ordered this encounter  Medications  . diltiazem (CARDIZEM) 30 MG tablet    Sig: Take 1 tablet (30 mg total) by mouth 2 (two) times daily. For rapid heartrate    Dispense:  30 tablet    Refill:  1     Chief Complaint  Patient presents with  . Follow-up  . Atrial Fibrillation   History of Present Illness:    Jason Mcconnell is a 85 y.o. male who is being seen today for the evaluation of atrial fibrillation after recent admission at [facility].   He presented to [facility] on 08/02/18 with chest discomfort and palpitations admitted with new onset atrial fibrillation with RVR rate 140. He converted after 1 time dose of IV Cardizem in the ED, had CTA negative for PE after d-dimer 574, was started on Eliquis and subsequently discharged. CXR 08/02/18 with no acute findings, "apically architectural distortion, compatible with emphysema". CTA with "scattered left coronary artery calcifications, mild aortic atherosclerosis", occasional small pulmonary nodules up to 48mm recommended for CT follow up in 12 months. EKG at discharge shows sinus bradycardia. Labs during hospitalization were notable for TSH 2.47, Hb drop from 13.9 to 12.3 over 24 hours, negative troponin x2, LDL 82.   Since discharge she has had brief rapid heart rhythms not sustained.  He exercises on regular basis and has no exertional chest pain shortness of breath change in exercise tolerance and he has had no bleeding or syncope.  His resting heart rate runs in the range of 60 to 65 bpm.  He asked me if he needs to remain anticoagulated for life and I told him the answers probable and we negotiated stay on his anticoagulant 6 months as well as watch for evidence of recurrence.  With his brief episodes of rapid heart rhythm he will wear a 7-day ZIO monitor.  His hemoglobin fell in the hospital we will recheck a CBC today please had  no clinical bleeding.  He has coronary artery calcification but no anginal discomfort and at this time I would not advise an ischemia evaluation.  He has no thyroid disease or sleep apnea.  He takes no over-the-counter proarrhythmic drugs although he does take a vitamin preparation for eye disease  Past Medical History:  Diagnosis Date  . Arthritis   . Atrial fibrillation ([place])    a. Dx 08/02/18 at [facility]  . Back pain   . Coronary artery calcification seen on CT scan   . EPIDIDYMAL CYST 12/19/2006   Qualifier: Diagnosis of  By: Linda Hedges  MD, Heinz Knuckles   . Fracture of distal end of radius 11/12/2017  . GERD (gastroesophageal reflux disease)   . Ingrown nail 07/24/2015  . Other malaise and fatigue 09/13/2007   Qualifier: Diagnosis of  By: Linda Hedges MD, Heinz Knuckles   . Pain in soft tissues of limb 07/24/2015  . Wrist pain 08/21/2018    Past Surgical History:  Procedure Laterality Date  . CHOLECYSTECTOMY    . right wrist frac      Current Medications: Current Meds  Medication Sig  . ELIQUIS 5 MG TABS tablet Take 5 mg by mouth 2 (two) times daily.  . Multiple Vitamins-Minerals (PRESERVISION AREDS 2+MULTI VIT PO) Take by mouth.  Marland Kitchen omeprazole (PRILOSEC) 20 MG capsule TAKE 1 CAPSULE ONCE DAILY EVERY MORNING.     Allergies:   Patient has no known allergies.   Social History   Socioeconomic History  . Marital status: Married    Spouse name: Not on file  . Number of children: Not on file  . Years of education: Not on file  . Highest education level: Not on file  Occupational History  . Not on file  Social Needs  . Financial resource strain: Not on file  . Food insecurity    Worry: Not on file    Inability: Not on file  . Transportation needs    Medical: Not on file    Non-medical: Not on file  Tobacco Use  . Smoking status: Former Smoker    Types: Cigarettes    Quit date: 08/20/1988    Years since quitting: 30.0  . Smokeless tobacco: Former Network engineer and Sexual Activity  . Alcohol use: Not on file    Comment: Very Seldom  . Drug use: Never  . Sexual activity: Not on file  Lifestyle  . Physical activity    Days per week: Not on file    Minutes per session: Not on file  . Stress: Not on file  Relationships  . Social Herbalist on phone: Not on file    Gets together: Not on file    Attends religious service: Not on file    Active member of club or organization: Not on file    Attends meetings of clubs or organizations: Not on file    Relationship status: Not on file  Other Topics Concern  .  Not on file  Social History Narrative  . Not on file     Family History: The patient's family history includes Alcohol abuse in his father; COPD in his mother; Cancer in his maternal grandmother; Parkinson's disease in his sister.  ROS:   Review of Systems  Constitution: Negative.  HENT: Negative.   Eyes: Positive for visual disturbance.  Cardiovascular: Positive for palpitations.  Respiratory: Negative.   Endocrine: Negative.   Hematologic/Lymphatic: Negative.   Skin: Negative.   Musculoskeletal: Negative.  Gastrointestinal: Negative.   Genitourinary: Negative.   Neurological: Negative.   Psychiatric/Behavioral: Negative.   Allergic/Immunologic: Negative.    Please see the history of present illness.     All other systems reviewed and are negative.  EKGs/Labs/Other Studies Reviewed:    The following studies were reviewed today:  CXR 08/02/18   no acute findings, "apically architectural distortion, compatible with emphysema".   CTA 08/02/18   scattered left coronary artery calcifications,   mild oartic atherosclerosis   occasional small pulmonary nodules up to 22mm   recommended for CT follow up in 12 months  EKG:  EKG is  ordered today.  The ekg ordered today is personally reviewed and demonstrates sinus rhythm and is normal  Recent Labs:  Per [facility] 08/03/18   wbc 3.6, rbc 3.71, Hb 12.3, Hct 36.7, MCV 99, MCH 33.2, Plt 155  Na 138, K 4.3, BUN 17, creatinine 0.9, GFR >60, Ca 8.3, Mag 2  08/02/18  Troponin <0.01 --> 0.03  A1c 5.7  TSH 2.47  Wbc 4.7, rbc 4.16, Hb 13.9, Hct 40.4, MCV 97, MCH 33.3, Plt 174  Recent Lipid Panel Per [facility] Records 08/03/18: Total 128, HDL 28, LDL 82, Triglycerides 90    Physical Exam:    VS:  BP 130/68 (BP Location: Right Arm, Patient Position: Sitting, Cuff Size: Normal)   Pulse 67   Temp 98.2 F (36.8 C)   Ht 6' (1.829 m)   Wt 169 lb (76.7 kg)   SpO2 96%   BMI 22.92 kg/m     Wt  Readings from Last 3 Encounters:  08/21/18 169 lb (76.7 kg)  11/15/06 171 lb (77.6 kg)  11/15/06 171 lb (77.6 kg)     GEN:  Well nourished, well developed in no acute distress HEENT: Normal NECK: No JVD; No carotid bruits LYMPHATICS: No lymphadenopathy CARDIAC: RRR, no murmurs, rubs, gallops RESPIRATORY:  Clear to auscultation without rales, wheezing or rhonchi  ABDOMEN: Soft, non-tender, non-distended MUSCULOSKELETAL:  No edema; No deformity  SKIN: Warm and dry NEUROLOGIC:  Alert and oriented x 3 PSYCHIATRIC:  Normal affect     Signed, Shirlee More, MD  08/22/2018 7:51 AM    [place]

## 2018-08-21 NOTE — Patient Instructions (Signed)
Medication Instructions:  Your physician has recommended you make the following change in your medication:   START: Cardizem 30 mg: Take 1 tab twice daily for rapid heartrate  If you need a refill on your cardiac medications before your next appointment, please call your pharmacy.   Lab work: Your physician recommends that you return for lab work in: TODAY CBC  If you have labs (blood work) drawn today and your tests are completely normal, you will receive your results only by: Marland Kitchen MyChart Message (if you have MyChart) OR . A paper copy in the mail If you have any lab test that is abnormal or we need to change your treatment, we will call you to review the results.  Testing/Procedures: Your physician has recommended that you wear a ZIO monitor. ZIO monitors are medical devices that record the heart's electrical activity. Doctors most often use these monitors to diagnose arrhythmias. Arrhythmias are problems with the speed or rhythm of the heartbeat. The monitor is a small, portable device. You can wear one while you do your normal daily activities. This is usually used to diagnose what is causing palpitations/syncope (passing out).  WEAR 7 DAYS  Follow-Up: At [facility], you and your health needs are our priority.  As part of our continuing mission to provide you with exceptional heart care, we have created designated Provider Care Teams.  These Care Teams include your primary Cardiologist (physician) and Advanced Practice Providers (APPs -  Physician Assistants and Nurse Practitioners) who all work together to provide you with the care you need, when you need it. You will need a follow up appointment in 6 weeks. With DR. Bettina Gavia  Any Other Special Instructions Will Be Listed Below (If Applicable).

## 2018-08-22 ENCOUNTER — Encounter: Payer: Self-pay | Admitting: Cardiology

## 2018-08-24 ENCOUNTER — Other Ambulatory Visit (INDEPENDENT_AMBULATORY_CARE_PROVIDER_SITE_OTHER): Payer: Medicare Other

## 2018-08-24 DIAGNOSIS — I4891 Unspecified atrial fibrillation: Secondary | ICD-10-CM | POA: Diagnosis not present

## 2018-08-25 DIAGNOSIS — H905 Unspecified sensorineural hearing loss: Secondary | ICD-10-CM | POA: Diagnosis not present

## 2018-08-31 ENCOUNTER — Other Ambulatory Visit: Payer: Self-pay

## 2018-08-31 ENCOUNTER — Encounter (INDEPENDENT_AMBULATORY_CARE_PROVIDER_SITE_OTHER): Payer: Medicare Other | Admitting: Ophthalmology

## 2018-08-31 DIAGNOSIS — H43813 Vitreous degeneration, bilateral: Secondary | ICD-10-CM | POA: Diagnosis not present

## 2018-08-31 DIAGNOSIS — H353132 Nonexudative age-related macular degeneration, bilateral, intermediate dry stage: Secondary | ICD-10-CM

## 2018-08-31 DIAGNOSIS — H34812 Central retinal vein occlusion, left eye, with macular edema: Secondary | ICD-10-CM

## 2018-08-31 DIAGNOSIS — I10 Essential (primary) hypertension: Secondary | ICD-10-CM

## 2018-08-31 DIAGNOSIS — H35033 Hypertensive retinopathy, bilateral: Secondary | ICD-10-CM

## 2018-09-06 DIAGNOSIS — H353132 Nonexudative age-related macular degeneration, bilateral, intermediate dry stage: Secondary | ICD-10-CM | POA: Diagnosis not present

## 2018-09-06 DIAGNOSIS — H5201 Hypermetropia, right eye: Secondary | ICD-10-CM | POA: Diagnosis not present

## 2018-09-07 DIAGNOSIS — I482 Chronic atrial fibrillation, unspecified: Secondary | ICD-10-CM | POA: Diagnosis not present

## 2018-09-26 ENCOUNTER — Telehealth: Payer: Self-pay | Admitting: Cardiology

## 2018-09-26 NOTE — Telephone Encounter (Signed)
Patient returned your call, please call wife number

## 2018-09-26 NOTE — Telephone Encounter (Signed)
Patient wife called and wants to know if the Eliquis would make him feel weak?

## 2018-09-26 NOTE — Telephone Encounter (Signed)
No answer on patient's wife, [name]'s number per DPR. Left message on patient's cell phone to return call to discuss.

## 2018-09-26 NOTE — Telephone Encounter (Signed)
Patient updated his wife phone number to [phone]. Please call that number.

## 2018-09-27 NOTE — Telephone Encounter (Signed)
Patient called back this morning with concern of weakness. He reports he has had a virus in the last week. Patient originally thought the weakness could be caused by eliquis but now thinks it might be related to the virus. Advised patient to contact our office if weakness does not resolve. Patient is agreeable and verbalized understanding. No further questions.

## 2018-10-22 NOTE — Progress Notes (Signed)
Cardiology Office Note:    Date:  10/24/2018   ID:  Jason Mcconnell, DOB 16-Mar-1930, MRN [id number]  PCP:  Nicoletta Dress, MD  Cardiologist:  Shirlee More, MD    Referring MD: Nicoletta Dress, MD    ASSESSMENT:    1. Paroxysmal atrial fibrillation (HCC)   2. Chronic anticoagulation    PLAN:    In order of problems listed above:  1. Stable asymptomatic he has had no clinical recurrence of atrial fibrillation and his event monitor showed no breakthrough arrhythmia.  I think the best course in the patient agrees his remain on rate limiting calcium channel blocker and his current anticoagulant.  I plan to see him in 1 year or sooner he follows closely with his primary care physician.   Next appointment: 1 year   Medication Adjustments/Labs and Tests Ordered: Current medicines are reviewed at length with the patient today.  Concerns regarding medicines are outlined above.  No orders of the defined types were placed in this encounter.  No orders of the defined types were placed in this encounter.   Chief Complaint  Patient presents with  . Follow-up  . Atrial Fibrillation    History of Present Illness:    Jason Mcconnell is a 85 y.o. male with a hx of atrial fibrillation last seen 08/21/2018.He presented to [facility] on 08/02/18 with chest discomfort and palpitations admitted with new onset atrial fibrillation with RVR rate 140. He converted after 1 time dose of IV Cardizem in the ED, had CTA negative for PE after d-dimer 574, was started on Eliquis and subsequently discharged. CXR 08/02/18 with no acute findings, "apically architectural distortion, compatible with emphysema". CTA with "scattered left coronary artery calcifications, mild aortic atherosclerosis", occasional small pulmonary nodules up to 66mm recommended for CT follow up in 12 months. EKG at discharge shows sinus bradycardia. Labs during hospitalization were notable for TSH 2.47, Hb drop from 13.9  to 12.3 over 24 hours, negative troponin x2, LDL 82.    Compliance with diet, lifestyle and medications: Yes  Overall he is done well he tolerates medications no bleeding complication has had no recurrence of atrial fibrillation.  I think the best approach now is to continue his rate limiting calcium channel blocker anticoagulant the patient agrees and I will plan to see him back in the office in 1 year or sooner if he has breakthrough episodes and continue to follow closely with his PCP.  Study Highlights  A ZIO monitor was performed for 6 days 19 hours beginning 08/24/2018 in order to evaluate atrial fibrillation.  The rhythm throughout was sinus with minimum average and maximum heart rates of 48, 64 and 98 bpm.  The minimum rate was sinus bradycardia.  There were no episodes of pauses of 3 seconds or greater and no episodes of AV block or sinus node exit block.  Ventricular ectopy was rare with isolated PVCs 3 couplets and 1 triplet.  There was one 13 beat run of relatively slow nonsustained ventricular tachycardia at 123 bpm that was symptomatic with palpitation.  Supraventricular arrhythmia was rare.  There were no episodes of atrial fibrillation or flutter.  There were 30.  Brief runs of atrial premature contractions, paroxysmal atrial tachycardia, longest 12.8 seconds at a rate of 107 bpm.  There were 3 diary events 1 associated with nonsustained ventricular tachycardia the others sinus rhythm.  There were 3 triggered events 1 associated with ventricular premature contractions.  The others sinus rhythm.  Conclusion, absence of atrial fibrillation.  Overall ventricular and supraventricular arrhythmias rare with brief episodes of atrial tachycardia and one 13 beat run of relatively slow nonsustained ventricular tachycardia.    Past Medical History:  Diagnosis Date  . Arthritis   . Atrial fibrillation ([place])    a. Dx 08/02/18 at [facility]  . Back pain   . Coronary  artery calcification seen on CT scan   . EPIDIDYMAL CYST 12/19/2006   Qualifier: Diagnosis of  By: Linda Hedges MD, Heinz Knuckles   . Fracture of distal end of radius 11/12/2017  . GERD (gastroesophageal reflux disease)   . Ingrown nail 07/24/2015  . Other malaise and fatigue 09/13/2007   Qualifier: Diagnosis of  By: Linda Hedges MD, Heinz Knuckles   . Pain in soft tissues of limb 07/24/2015  . Wrist pain 08/21/2018    Past Surgical History:  Procedure Laterality Date  . CHOLECYSTECTOMY    . right wrist frac      Current Medications: Current Meds  Medication Sig  . diltiazem (CARDIZEM) 30 MG tablet Take 30 mg by mouth 2 (two) times daily as needed (for rapid heart rate).  Marland Kitchen ELIQUIS 5 MG TABS tablet Take 5 mg by mouth 2 (two) times daily.  . Multiple Vitamins-Minerals (PRESERVISION AREDS 2+MULTI VIT PO) Take 1 tablet by mouth daily.   Marland Kitchen omeprazole (PRILOSEC) 20 MG capsule TAKE 1 CAPSULE ONCE DAILY EVERY MORNING.     Allergies:   Patient has no known allergies.   Social History   Socioeconomic History  . Marital status: Married    Spouse name: Not on file  . Number of children: Not on file  . Years of education: Not on file  . Highest education level: Not on file  Occupational History  . Not on file  Social Needs  . Financial resource strain: Not on file  . Food insecurity    Worry: Not on file    Inability: Not on file  . Transportation needs    Medical: Not on file    Non-medical: Not on file  Tobacco Use  . Smoking status: Former Smoker    Types: Cigarettes    Quit date: 08/20/1988    Years since quitting: 30.1  . Smokeless tobacco: Former Systems developer    Types: Chew  Substance and Sexual Activity  . Alcohol use: Yes    Comment: Very Seldom  . Drug use: Never  . Sexual activity: Not on file  Lifestyle  . Physical activity    Days per week: Not on file    Minutes per session: Not on file  . Stress: Not on file  Relationships  . Social Herbalist on phone: Not on file    Gets  together: Not on file    Attends religious service: Not on file    Active member of club or organization: Not on file    Attends meetings of clubs or organizations: Not on file    Relationship status: Not on file  Other Topics Concern  . Not on file  Social History Narrative  . Not on file     Family History: The patient's family history includes Alcohol abuse in his father; COPD in his mother; Cancer in his maternal grandmother; Parkinson's disease in his sister. ROS:   Please see the history of present illness.    All other systems reviewed and are negative.  EKGs/Labs/Other Studies Reviewed:    The following studies were reviewed today:  EKG:  EKG ordered today and personally reviewed.  The ekg ordered today demonstrates sinus rhythm and is normal Echo done in July and showed normal ejection fraction normal left and right atrial size Recent Labs: 08/21/2018: Hemoglobin 13.6; Platelets 197  Recent Lipid Panel    Component Value Date/Time   CHOL 194 02/10/2006 0946   TRIG 111 02/10/2006 0946   HDL 42.3 02/10/2006 0946   CHOLHDL 4.6 CALC 02/10/2006 0946   VLDL 22 02/10/2006 0946   LDLCALC 130 (H) 02/10/2006 0946    Physical Exam:    VS:  BP 140/66 (BP Location: Right Arm, Patient Position: Sitting, Cuff Size: Normal)   Pulse 66   Ht 6' (1.829 m)   Wt 172 lb 9.6 oz (78.3 kg)   SpO2 98%   BMI 23.41 kg/m     Wt Readings from Last 3 Encounters:  10/24/18 172 lb 9.6 oz (78.3 kg)  08/21/18 169 lb (76.7 kg)  11/15/06 171 lb (77.6 kg)     GEN:  Well nourished, well developed in no acute distress HEENT: Normal NECK: No JVD; No carotid bruits LYMPHATICS: No lymphadenopathy CARDIAC: RRR, no murmurs, rubs, gallops RESPIRATORY:  Clear to auscultation without rales, wheezing or rhonchi  ABDOMEN: Soft, non-tender, non-distended MUSCULOSKELETAL:  No edema; No deformity  SKIN: Warm and dry NEUROLOGIC:  Alert and oriented x 3 PSYCHIATRIC:  Normal affect    Signed, Shirlee More, MD  10/24/2018 3:29 PM    [facility]

## 2018-10-24 ENCOUNTER — Other Ambulatory Visit: Payer: Self-pay

## 2018-10-24 ENCOUNTER — Ambulatory Visit (INDEPENDENT_AMBULATORY_CARE_PROVIDER_SITE_OTHER): Payer: Medicare Other | Admitting: Cardiology

## 2018-10-24 ENCOUNTER — Encounter: Payer: Self-pay | Admitting: Cardiology

## 2018-10-24 VITALS — BP 140/66 | HR 66 | Ht 72.0 in | Wt 172.6 lb

## 2018-10-24 DIAGNOSIS — I480 Paroxysmal atrial fibrillation: Secondary | ICD-10-CM | POA: Diagnosis not present

## 2018-10-24 DIAGNOSIS — Z7901 Long term (current) use of anticoagulants: Secondary | ICD-10-CM

## 2018-10-24 NOTE — Patient Instructions (Signed)

## 2018-11-02 ENCOUNTER — Telehealth: Payer: Self-pay | Admitting: Cardiology

## 2018-11-02 MED ORDER — ELIQUIS 5 MG PO TABS: 5.0000 mg | ORAL_TABLET | Freq: Two times a day (BID) | ORAL | 3 refills | Status: DC

## 2018-11-02 NOTE — Telephone Encounter (Signed)
Rx for Eliquis 5mg  one tablet bid sent to [facility] as required.

## 2018-11-02 NOTE — Telephone Encounter (Signed)
°*  STAT* If patient is at the pharmacy, call can be transferred to refill team.   1. Which medications need to be refilled? (please list name of each medication and dose if known) ELIQUIS 5 MG TABS tablet    2. Which pharmacy/location (including street and city if local pharmacy) is medication to be sent to?  [place], [place] [phone] (Phone) [phone] (Fax)    3. Do they need a 30 day or 90 day supply?

## 2018-11-16 ENCOUNTER — Encounter (INDEPENDENT_AMBULATORY_CARE_PROVIDER_SITE_OTHER): Payer: Medicare Other | Admitting: Ophthalmology

## 2018-11-16 ENCOUNTER — Other Ambulatory Visit: Payer: Self-pay

## 2018-11-16 DIAGNOSIS — I10 Essential (primary) hypertension: Secondary | ICD-10-CM

## 2018-11-16 DIAGNOSIS — H43813 Vitreous degeneration, bilateral: Secondary | ICD-10-CM | POA: Diagnosis not present

## 2018-11-16 DIAGNOSIS — H353132 Nonexudative age-related macular degeneration, bilateral, intermediate dry stage: Secondary | ICD-10-CM | POA: Diagnosis not present

## 2018-11-16 DIAGNOSIS — H34812 Central retinal vein occlusion, left eye, with macular edema: Secondary | ICD-10-CM | POA: Diagnosis not present

## 2018-11-16 DIAGNOSIS — H35033 Hypertensive retinopathy, bilateral: Secondary | ICD-10-CM

## 2019-02-01 ENCOUNTER — Encounter (INDEPENDENT_AMBULATORY_CARE_PROVIDER_SITE_OTHER): Payer: Medicare Other | Admitting: Ophthalmology

## 2019-02-01 DIAGNOSIS — H35033 Hypertensive retinopathy, bilateral: Secondary | ICD-10-CM | POA: Diagnosis not present

## 2019-02-01 DIAGNOSIS — H34812 Central retinal vein occlusion, left eye, with macular edema: Secondary | ICD-10-CM

## 2019-02-01 DIAGNOSIS — I10 Essential (primary) hypertension: Secondary | ICD-10-CM | POA: Diagnosis not present

## 2019-02-01 DIAGNOSIS — H43813 Vitreous degeneration, bilateral: Secondary | ICD-10-CM | POA: Diagnosis not present

## 2019-02-01 DIAGNOSIS — H353132 Nonexudative age-related macular degeneration, bilateral, intermediate dry stage: Secondary | ICD-10-CM

## 2019-02-14 ENCOUNTER — Telehealth: Payer: Self-pay | Admitting: Cardiology

## 2019-02-14 NOTE — Telephone Encounter (Signed)
[name]:  1. Name of Medication: ELIQUIS 5 MG TABS tablet  2. How are you currently taking this medication (dosage and times per day)? 1 tablet by mouth two times daily   3. Are you having a reaction (difficulty breathing--STAT)? Yes   4. What is your medication issue? Patient is calling stating he has noticed when he wakes up in the morning there has been little drops of blood on his pillow from having a light nose bleed over the night. He states it does not happen frequently and is not severe but wanted to inform Dr. Bettina Gavia of the issue.

## 2019-02-14 NOTE — Telephone Encounter (Signed)
I spoke to the patient who called to inform us that he is on Eliquis 5 mg bid and two days ago noticed a "slight" nose bleed on his pillow when he woke up.  It has been very infrequent 3-4 times/yr, but wanted to inform us.    He has no blood in his stool, only slight nosebleeds.

## 2019-04-05 ENCOUNTER — Encounter (INDEPENDENT_AMBULATORY_CARE_PROVIDER_SITE_OTHER): Payer: Medicare Other | Admitting: Ophthalmology

## 2019-04-05 DIAGNOSIS — H34812 Central retinal vein occlusion, left eye, with macular edema: Secondary | ICD-10-CM

## 2019-04-05 DIAGNOSIS — H353132 Nonexudative age-related macular degeneration, bilateral, intermediate dry stage: Secondary | ICD-10-CM | POA: Diagnosis not present

## 2019-04-05 DIAGNOSIS — I10 Essential (primary) hypertension: Secondary | ICD-10-CM

## 2019-04-05 DIAGNOSIS — H35033 Hypertensive retinopathy, bilateral: Secondary | ICD-10-CM

## 2019-04-05 DIAGNOSIS — H43813 Vitreous degeneration, bilateral: Secondary | ICD-10-CM | POA: Diagnosis not present

## 2019-04-30 ENCOUNTER — Other Ambulatory Visit: Payer: Self-pay | Admitting: Cardiology

## 2019-05-01 NOTE — Telephone Encounter (Signed)
LOV was 10/24/2018 with [facility] and no future appointments scheduled.

## 2019-06-06 DIAGNOSIS — Z139 Encounter for screening, unspecified: Secondary | ICD-10-CM | POA: Diagnosis not present

## 2019-06-06 DIAGNOSIS — Z0000 Encounter for general adult medical examination without abnormal findings: Secondary | ICD-10-CM | POA: Diagnosis not present

## 2019-06-06 DIAGNOSIS — I480 Paroxysmal atrial fibrillation: Secondary | ICD-10-CM | POA: Diagnosis not present

## 2019-06-06 DIAGNOSIS — E785 Hyperlipidemia, unspecified: Secondary | ICD-10-CM | POA: Diagnosis not present

## 2019-06-06 DIAGNOSIS — R7301 Impaired fasting glucose: Secondary | ICD-10-CM | POA: Diagnosis not present

## 2019-06-07 ENCOUNTER — Other Ambulatory Visit: Payer: Self-pay

## 2019-06-07 ENCOUNTER — Encounter (INDEPENDENT_AMBULATORY_CARE_PROVIDER_SITE_OTHER): Payer: Medicare Other | Admitting: Ophthalmology

## 2019-06-07 DIAGNOSIS — I10 Essential (primary) hypertension: Secondary | ICD-10-CM | POA: Diagnosis not present

## 2019-06-07 DIAGNOSIS — H43813 Vitreous degeneration, bilateral: Secondary | ICD-10-CM

## 2019-06-07 DIAGNOSIS — H34812 Central retinal vein occlusion, left eye, with macular edema: Secondary | ICD-10-CM

## 2019-06-07 DIAGNOSIS — H35033 Hypertensive retinopathy, bilateral: Secondary | ICD-10-CM | POA: Diagnosis not present

## 2019-06-07 DIAGNOSIS — H353132 Nonexudative age-related macular degeneration, bilateral, intermediate dry stage: Secondary | ICD-10-CM | POA: Diagnosis not present

## 2019-08-08 ENCOUNTER — Encounter (INDEPENDENT_AMBULATORY_CARE_PROVIDER_SITE_OTHER): Payer: Medicare Other | Admitting: Ophthalmology

## 2019-08-08 ENCOUNTER — Other Ambulatory Visit: Payer: Self-pay

## 2019-08-08 DIAGNOSIS — H43813 Vitreous degeneration, bilateral: Secondary | ICD-10-CM | POA: Diagnosis not present

## 2019-08-08 DIAGNOSIS — H34812 Central retinal vein occlusion, left eye, with macular edema: Secondary | ICD-10-CM

## 2019-08-08 DIAGNOSIS — I10 Essential (primary) hypertension: Secondary | ICD-10-CM | POA: Diagnosis not present

## 2019-08-08 DIAGNOSIS — H353132 Nonexudative age-related macular degeneration, bilateral, intermediate dry stage: Secondary | ICD-10-CM

## 2019-08-08 DIAGNOSIS — H35033 Hypertensive retinopathy, bilateral: Secondary | ICD-10-CM | POA: Diagnosis not present

## 2019-08-09 ENCOUNTER — Encounter (INDEPENDENT_AMBULATORY_CARE_PROVIDER_SITE_OTHER): Payer: Medicare Other | Admitting: Ophthalmology

## 2019-09-05 DIAGNOSIS — Z0000 Encounter for general adult medical examination without abnormal findings: Secondary | ICD-10-CM | POA: Diagnosis not present

## 2019-09-05 DIAGNOSIS — E785 Hyperlipidemia, unspecified: Secondary | ICD-10-CM | POA: Diagnosis not present

## 2019-09-05 DIAGNOSIS — Z9181 History of falling: Secondary | ICD-10-CM | POA: Diagnosis not present

## 2019-10-10 ENCOUNTER — Encounter (INDEPENDENT_AMBULATORY_CARE_PROVIDER_SITE_OTHER): Payer: Medicare Other | Admitting: Ophthalmology

## 2019-10-10 ENCOUNTER — Other Ambulatory Visit: Payer: Self-pay

## 2019-10-10 DIAGNOSIS — H34812 Central retinal vein occlusion, left eye, with macular edema: Secondary | ICD-10-CM | POA: Diagnosis not present

## 2019-10-10 DIAGNOSIS — H43813 Vitreous degeneration, bilateral: Secondary | ICD-10-CM | POA: Diagnosis not present

## 2019-10-10 DIAGNOSIS — H353132 Nonexudative age-related macular degeneration, bilateral, intermediate dry stage: Secondary | ICD-10-CM

## 2019-10-31 NOTE — Progress Notes (Signed)
Cardiology Office Note:    Date:  11/01/2019   ID:  Jason Mcconnell, DOB 1930/10/26, MRN [id number]  PCP:  Nicoletta Dress, MD  Cardiologist:  Shirlee More, MD    Referring MD: Nicoletta Dress, MD    ASSESSMENT:    1. Paroxysmal atrial fibrillation (HCC)   2. Persistent atrial fibrillation ([place])   3. Chronic anticoagulation   4. Coronary artery calcification seen on CT scan    PLAN:    In order of problems listed above:  1. He maintains sinus rhythm no recurrence of atrial fibrillation he will take a calcium channel blocker if needed continue his anticoagulation. 2. Coronary artery calcification not unusual in his age group he has no anginal discomfort and I would not pursue an ischemia evaluation.   Next appointment: As needed   Medication Adjustments/Labs and Tests Ordered: Current medicines are reviewed at length with the patient today.  Concerns regarding medicines are outlined above.  Orders Placed This Encounter  Procedures  . EKG 12-Lead   No orders of the defined types were placed in this encounter.   Chief Complaint  Patient presents with  . Follow-up  . Atrial Fibrillation  . Anticoagulation    History of Present Illness:    Jason Mcconnell is a 86 y.o. male with a hx of paroxysmal atrial fibrillation anticoagulated and coronary artery calcification last seen 10/24/2018.  He had one episode of atrial fibrillation with rapid rate August 2020 and spontaneously cardioverted to sinus rhythm in the emergency room department. Compliance with diet, lifestyle and medications: Yes  He has good he needs to continue his anticoagulant and I think in view of his age and documented atrial fibrillation and stroke risk he should and he agrees.  He has had no palpitation and has not required to take the calcium channel blocker.  The quality of his life is good he continues to work and he plays golf he has no exercise intolerance chest pain shortness of  breath palpitation or syncope. Past Medical History:  Diagnosis Date  . Arthritis   . Atrial fibrillation ([place])    a. Dx 08/02/18 at [facility]  . Back pain   . Coronary artery calcification seen on CT scan   . EPIDIDYMAL CYST 12/19/2006   Qualifier: Diagnosis of  By: Linda Hedges MD, Heinz Knuckles   . Fracture of distal end of radius 11/12/2017  . GERD (gastroesophageal reflux disease)   . Ingrown nail 07/24/2015  . Other malaise and fatigue 09/13/2007   Qualifier: Diagnosis of  By: Linda Hedges MD, Heinz Knuckles   . Pain in soft tissues of limb 07/24/2015  . Wrist pain 08/21/2018    Past Surgical History:  Procedure Laterality Date  . CHOLECYSTECTOMY    . right wrist frac      Current Medications: Current Meds  Medication Sig  . ELIQUIS 5 MG TABS tablet TAKE 1 TABLET BY MOUTH TWICE DAILY.  . Multiple Vitamins-Minerals (PRESERVISION AREDS 2+MULTI VIT PO) Take 1 tablet by mouth daily.   Marland Kitchen omeprazole (PRILOSEC) 20 MG capsule TAKE 1 CAPSULE ONCE DAILY EVERY MORNING.     Allergies:   Patient has no known allergies.   Social History   Socioeconomic History  . Marital status: Married    Spouse name: Not on file  . Number of children: Not on file  . Years of education: Not on file  . Highest education level: Not on file  Occupational History  . Not on file  Tobacco  Use  . Smoking status: Former Smoker    Types: Cigarettes    Quit date: 08/20/1988    Years since quitting: 31.2  . Smokeless tobacco: Former Systems developer    Types: Secondary school teacher  . Vaping Use: Never used  Substance and Sexual Activity  . Alcohol use: Yes    Comment: Very Seldom  . Drug use: Never  . Sexual activity: Not on file  Other Topics Concern  . Not on file  Social History Narrative  . Not on file   Social Determinants of Health   Financial Resource Strain:   . Difficulty of Paying Living Expenses: Not on file  Food Insecurity:   . Worried About Charity fundraiser in the Last Year: Not on file  . Ran Out of Food  in the Last Year: Not on file  Transportation Needs:   . Lack of Transportation (Medical): Not on file  . Lack of Transportation (Non-Medical): Not on file  Physical Activity:   . Days of Exercise per Week: Not on file  . Minutes of Exercise per Session: Not on file  Stress:   . Feeling of Stress : Not on file  Social Connections:   . Frequency of Communication with Friends and Family: Not on file  . Frequency of Social Gatherings with Friends and Family: Not on file  . Attends Religious Services: Not on file  . Active Member of Clubs or Organizations: Not on file  . Attends Archivist Meetings: Not on file  . Marital Status: Not on file     Family History: The patient's family history includes Alcohol abuse in his father; COPD in his mother; Cancer in his maternal grandmother; Parkinson's disease in his sister. ROS:   Please see the history of present illness.    All other systems reviewed and are negative.  EKGs/Labs/Other Studies Reviewed:    The following studies were reviewed today:  EKG:  EKG ordered today and personally reviewed.  The ekg ordered today demonstrates sinus rhythm and is normal  Recent Labs: 06/06/2019: Lipids at target, cholesterol 161 LDL 103 triglycerides 93 HDL 41 A1c normal 5.8% TSH normal 1.87. Recent Lipid Panel    Component Value Date/Time   CHOL 194 02/10/2006 0946   TRIG 111 02/10/2006 0946   HDL 42.3 02/10/2006 0946   CHOLHDL 4.6 CALC 02/10/2006 0946   VLDL 22 02/10/2006 0946   LDLCALC 130 (H) 02/10/2006 0946    Physical Exam:    VS:  BP 134/75   Pulse 66   Ht 6' (1.829 m)   Wt 165 lb (74.8 kg)   SpO2 97%   BMI 22.38 kg/m     Wt Readings from Last 3 Encounters:  11/01/19 165 lb (74.8 kg)  10/24/18 172 lb 9.6 oz (78.3 kg)  08/21/18 169 lb (76.7 kg)     GEN: He looks younger than his age well nourished, well developed in no acute distress HEENT: Normal NECK: No JVD; No carotid bruits LYMPHATICS: No  lymphadenopathy CARDIAC: RRR, no murmurs, rubs, gallops RESPIRATORY:  Clear to auscultation without rales, wheezing or rhonchi  ABDOMEN: Soft, non-tender, non-distended MUSCULOSKELETAL:  No edema; No deformity  SKIN: Warm and dry NEUROLOGIC:  Alert and oriented x 3 PSYCHIATRIC:  Normal affect    Signed, Shirlee More, MD  11/01/2019 2:49 PM    [place]

## 2019-11-01 ENCOUNTER — Encounter: Payer: Self-pay | Admitting: Cardiology

## 2019-11-01 ENCOUNTER — Other Ambulatory Visit: Payer: Self-pay

## 2019-11-01 ENCOUNTER — Ambulatory Visit: Payer: Medicare Other | Admitting: Cardiology

## 2019-11-01 VITALS — BP 134/75 | HR 66 | Ht 72.0 in | Wt 165.0 lb

## 2019-11-01 DIAGNOSIS — I2510 Atherosclerotic heart disease of native coronary artery without angina pectoris: Secondary | ICD-10-CM | POA: Diagnosis not present

## 2019-11-01 DIAGNOSIS — Z7901 Long term (current) use of anticoagulants: Secondary | ICD-10-CM

## 2019-11-01 DIAGNOSIS — I480 Paroxysmal atrial fibrillation: Secondary | ICD-10-CM

## 2019-11-01 DIAGNOSIS — I4819 Other persistent atrial fibrillation: Secondary | ICD-10-CM

## 2019-11-01 NOTE — Patient Instructions (Signed)

## 2019-11-09 DIAGNOSIS — L821 Other seborrheic keratosis: Secondary | ICD-10-CM | POA: Diagnosis not present

## 2019-11-12 ENCOUNTER — Other Ambulatory Visit: Payer: Self-pay | Admitting: Cardiology

## 2019-11-28 DIAGNOSIS — H353132 Nonexudative age-related macular degeneration, bilateral, intermediate dry stage: Secondary | ICD-10-CM | POA: Diagnosis not present

## 2019-11-28 DIAGNOSIS — Z9849 Cataract extraction status, unspecified eye: Secondary | ICD-10-CM | POA: Diagnosis not present

## 2019-12-07 DIAGNOSIS — Z23 Encounter for immunization: Secondary | ICD-10-CM | POA: Diagnosis not present

## 2019-12-12 ENCOUNTER — Encounter (INDEPENDENT_AMBULATORY_CARE_PROVIDER_SITE_OTHER): Payer: Medicare Other | Admitting: Ophthalmology

## 2019-12-12 ENCOUNTER — Other Ambulatory Visit: Payer: Self-pay

## 2019-12-12 DIAGNOSIS — H353132 Nonexudative age-related macular degeneration, bilateral, intermediate dry stage: Secondary | ICD-10-CM

## 2019-12-12 DIAGNOSIS — H34812 Central retinal vein occlusion, left eye, with macular edema: Secondary | ICD-10-CM | POA: Diagnosis not present

## 2019-12-12 DIAGNOSIS — H43813 Vitreous degeneration, bilateral: Secondary | ICD-10-CM

## 2020-02-13 ENCOUNTER — Encounter (INDEPENDENT_AMBULATORY_CARE_PROVIDER_SITE_OTHER): Payer: Medicare Other | Admitting: Ophthalmology

## 2020-02-20 ENCOUNTER — Encounter (INDEPENDENT_AMBULATORY_CARE_PROVIDER_SITE_OTHER): Payer: Medicare Other | Admitting: Ophthalmology

## 2020-02-20 ENCOUNTER — Other Ambulatory Visit: Payer: Self-pay

## 2020-02-20 DIAGNOSIS — H43813 Vitreous degeneration, bilateral: Secondary | ICD-10-CM | POA: Diagnosis not present

## 2020-02-20 DIAGNOSIS — H353112 Nonexudative age-related macular degeneration, right eye, intermediate dry stage: Secondary | ICD-10-CM

## 2020-02-20 DIAGNOSIS — H34812 Central retinal vein occlusion, left eye, with macular edema: Secondary | ICD-10-CM

## 2020-04-23 ENCOUNTER — Other Ambulatory Visit: Payer: Self-pay

## 2020-04-23 ENCOUNTER — Encounter (INDEPENDENT_AMBULATORY_CARE_PROVIDER_SITE_OTHER): Payer: Medicare Other | Admitting: Ophthalmology

## 2020-04-23 DIAGNOSIS — H34812 Central retinal vein occlusion, left eye, with macular edema: Secondary | ICD-10-CM | POA: Diagnosis not present

## 2020-04-23 DIAGNOSIS — H353132 Nonexudative age-related macular degeneration, bilateral, intermediate dry stage: Secondary | ICD-10-CM | POA: Diagnosis not present

## 2020-04-23 DIAGNOSIS — H43813 Vitreous degeneration, bilateral: Secondary | ICD-10-CM

## 2020-05-28 ENCOUNTER — Other Ambulatory Visit: Payer: Self-pay | Admitting: Cardiology

## 2020-05-28 NOTE — Telephone Encounter (Signed)
28m, 74.8kg, Creatinine, Serum 1.120 mg/ 06/06/2019, lovw/ munley 11/01/19

## 2020-06-06 DIAGNOSIS — Z0000 Encounter for general adult medical examination without abnormal findings: Secondary | ICD-10-CM | POA: Diagnosis not present

## 2020-06-25 ENCOUNTER — Other Ambulatory Visit: Payer: Self-pay

## 2020-06-25 ENCOUNTER — Ambulatory Visit (INDEPENDENT_AMBULATORY_CARE_PROVIDER_SITE_OTHER): Payer: Medicare Other

## 2020-06-25 ENCOUNTER — Encounter: Payer: Self-pay | Admitting: Sports Medicine

## 2020-06-25 ENCOUNTER — Ambulatory Visit: Payer: Medicare Other | Admitting: Sports Medicine

## 2020-06-25 ENCOUNTER — Other Ambulatory Visit: Payer: Self-pay | Admitting: Sports Medicine

## 2020-06-25 DIAGNOSIS — M2041 Other hammer toe(s) (acquired), right foot: Secondary | ICD-10-CM

## 2020-06-25 DIAGNOSIS — M2528 Flail joint, other site: Secondary | ICD-10-CM | POA: Diagnosis not present

## 2020-06-25 DIAGNOSIS — M79671 Pain in right foot: Secondary | ICD-10-CM | POA: Diagnosis not present

## 2020-06-25 NOTE — Progress Notes (Signed)
Subjective: Jason Mcconnell is a 87 y.o. male patient who presents to office for evaluation of Right 5th toe pain, reports that it is very sensitive when rubs worse in a shoe, using bandaids. Denies any symptoms or signs of infection. No recent trauma/injury. Patient denies any other pedal complaints.    On Eliquis.  Review of Systems  All other systems reviewed and are negative.    Patient Active Problem List   Diagnosis Date Noted  . Wrist pain 08/21/2018  . GERD (gastroesophageal reflux disease)   . Coronary artery calcification seen on CT scan   . Atrial fibrillation ([facility])   . Back pain   . Arthritis   . Fracture of distal end of radius 11/12/2017  . Ingrown nail 07/24/2015  . Pain in soft tissues of limb 07/24/2015  . OTHER MALAISE AND FATIGUE 09/13/2007  . EPIDIDYMAL CYST 12/19/2006    Current Outpatient Medications on File Prior to Visit  Medication Sig Dispense Refill  . ELIQUIS 5 MG TABS tablet TAKE 1 TABLET BY MOUTH TWICE DAILY 180 tablet 1   No current facility-administered medications on file prior to visit.    No Known Allergies  Objective:  General: Alert and oriented x3 in no acute distress  Dermatology: No open lesion, no webspace macerations, no ecchymosis bilateral, all nails x 10 are short and thick but well manicured.  Vascular: Dorsalis Pedis and Posterior Tibial pedal pulses 1/4, Capillary Fill Time 3 seconds,(+) pedal hair growth bilateral, no edema bilateral lower extremities, Temperature gradient within normal limits.  Neurology: Johney Maine sensation intact via light touch bilateral.   Musculoskeletal:Rigid 5th hammertoe with contracted soft tissue right 5th toe with severe dislocation and overlapping. Mild pain to right 5th toe.  Strength within normal limits in all groups bilateral.   Gait: Unassisted  Xrays  Right Foot    Impression:dislocated 5th overlapping hammertoe, diffuse arthritis of midfoot.        Assessment and Plan: Problem  List Items Addressed This Visit   None   Visit Diagnoses    Hammertoe of right foot    -  Primary   Flail toe       Right foot pain           -Complete examination performed -Xrays reviewed -Discussed treatement options for right 5th toe, overlapping with contracture -Dispensed toe cap, advised patient to consider shoes that do not rub -Advised patient to consider surgery to have the toe removed is continues to be painful -Patient to return to office surgery consult or sooner if condition worsens.  Landis Martins, DPM

## 2020-06-26 ENCOUNTER — Encounter (INDEPENDENT_AMBULATORY_CARE_PROVIDER_SITE_OTHER): Payer: Medicare Other | Admitting: Ophthalmology

## 2020-06-26 DIAGNOSIS — H43813 Vitreous degeneration, bilateral: Secondary | ICD-10-CM

## 2020-06-26 DIAGNOSIS — H353132 Nonexudative age-related macular degeneration, bilateral, intermediate dry stage: Secondary | ICD-10-CM

## 2020-06-26 DIAGNOSIS — H34812 Central retinal vein occlusion, left eye, with macular edema: Secondary | ICD-10-CM | POA: Diagnosis not present

## 2020-07-18 ENCOUNTER — Ambulatory Visit: Payer: Medicare Other | Admitting: Sports Medicine

## 2020-07-24 DIAGNOSIS — J208 Acute bronchitis due to other specified organisms: Secondary | ICD-10-CM | POA: Diagnosis not present

## 2020-07-24 DIAGNOSIS — B9689 Other specified bacterial agents as the cause of diseases classified elsewhere: Secondary | ICD-10-CM | POA: Diagnosis not present

## 2020-07-30 ENCOUNTER — Other Ambulatory Visit: Payer: Self-pay | Admitting: Cardiology

## 2020-08-11 ENCOUNTER — Telehealth: Payer: Self-pay | Admitting: Cardiology

## 2020-08-11 NOTE — Telephone Encounter (Signed)
Spoke to the patient just now and let him know that we did recommend that he get his second COIVD booster. He verbalizes understanding and thanks me for the call back.

## 2020-08-11 NOTE — Telephone Encounter (Signed)
Left message on patients voicemail to please return our call.   

## 2020-08-11 NOTE — Telephone Encounter (Signed)
Follow Up:      Pt wants to know if he should he take the second Booster for Covid?

## 2020-08-21 DIAGNOSIS — L814 Other melanin hyperpigmentation: Secondary | ICD-10-CM | POA: Diagnosis not present

## 2020-08-21 DIAGNOSIS — L570 Actinic keratosis: Secondary | ICD-10-CM | POA: Diagnosis not present

## 2020-08-21 DIAGNOSIS — L821 Other seborrheic keratosis: Secondary | ICD-10-CM | POA: Diagnosis not present

## 2020-09-04 ENCOUNTER — Other Ambulatory Visit: Payer: Self-pay

## 2020-09-04 ENCOUNTER — Encounter (INDEPENDENT_AMBULATORY_CARE_PROVIDER_SITE_OTHER): Payer: Medicare Other | Admitting: Ophthalmology

## 2020-09-04 DIAGNOSIS — H34812 Central retinal vein occlusion, left eye, with macular edema: Secondary | ICD-10-CM

## 2020-09-04 DIAGNOSIS — H43813 Vitreous degeneration, bilateral: Secondary | ICD-10-CM | POA: Diagnosis not present

## 2020-09-04 DIAGNOSIS — H353132 Nonexudative age-related macular degeneration, bilateral, intermediate dry stage: Secondary | ICD-10-CM

## 2020-10-15 DIAGNOSIS — H353132 Nonexudative age-related macular degeneration, bilateral, intermediate dry stage: Secondary | ICD-10-CM | POA: Diagnosis not present

## 2020-10-15 DIAGNOSIS — Z9849 Cataract extraction status, unspecified eye: Secondary | ICD-10-CM | POA: Diagnosis not present

## 2020-10-15 DIAGNOSIS — H524 Presbyopia: Secondary | ICD-10-CM | POA: Diagnosis not present

## 2020-10-15 DIAGNOSIS — Z961 Presence of intraocular lens: Secondary | ICD-10-CM | POA: Diagnosis not present

## 2020-11-07 DIAGNOSIS — Z23 Encounter for immunization: Secondary | ICD-10-CM | POA: Diagnosis not present

## 2020-11-20 ENCOUNTER — Other Ambulatory Visit: Payer: Self-pay

## 2020-11-20 ENCOUNTER — Encounter (INDEPENDENT_AMBULATORY_CARE_PROVIDER_SITE_OTHER): Payer: Medicare Other | Admitting: Ophthalmology

## 2020-11-20 DIAGNOSIS — H43813 Vitreous degeneration, bilateral: Secondary | ICD-10-CM

## 2020-11-20 DIAGNOSIS — H34812 Central retinal vein occlusion, left eye, with macular edema: Secondary | ICD-10-CM

## 2020-11-20 DIAGNOSIS — H353132 Nonexudative age-related macular degeneration, bilateral, intermediate dry stage: Secondary | ICD-10-CM

## 2020-11-28 ENCOUNTER — Other Ambulatory Visit: Payer: Self-pay | Admitting: Cardiology

## 2020-11-28 DIAGNOSIS — I4891 Unspecified atrial fibrillation: Secondary | ICD-10-CM

## 2020-12-01 DIAGNOSIS — H348122 Central retinal vein occlusion, left eye, stable: Secondary | ICD-10-CM | POA: Diagnosis not present

## 2020-12-01 DIAGNOSIS — H52223 Regular astigmatism, bilateral: Secondary | ICD-10-CM | POA: Diagnosis not present

## 2020-12-01 DIAGNOSIS — H524 Presbyopia: Secondary | ICD-10-CM | POA: Diagnosis not present

## 2020-12-01 DIAGNOSIS — H353132 Nonexudative age-related macular degeneration, bilateral, intermediate dry stage: Secondary | ICD-10-CM | POA: Diagnosis not present

## 2020-12-01 NOTE — Telephone Encounter (Addendum)
Prescription refill request for Eliquis received. Indication: afib  Last office visit: [name], 11/01/2019 Scr: 1.22, 06/06/2020 via [facility]  Age: 87 yo Weight: 74.8 kg   Pt due to see cardiologist 11/17. Will send in refill so pt does not run out.

## 2020-12-01 NOTE — Telephone Encounter (Signed)
Called and spoke w/pt who stated that they have an appt at the [facility] office for Thursday and that they will get the labs done then

## 2020-12-01 NOTE — Addendum Note (Signed)
Addended by: Allean Found on: 12/01/2020 03:20 PM   Modules accepted: Orders

## 2020-12-02 DIAGNOSIS — I4891 Unspecified atrial fibrillation: Secondary | ICD-10-CM | POA: Diagnosis not present

## 2020-12-02 NOTE — Telephone Encounter (Signed)
I have added note to appt that pt needs labs drawn at office visit.

## 2020-12-03 LAB — BASIC METABOLIC PANEL
BUN/Creatinine Ratio: 13 (ref 10–24)
BUN: 16 mg/dL (ref 8–27)
CO2: 26 mmol/L (ref 20–29)
Calcium: 9.2 mg/dL (ref 8.6–10.2)
Chloride: 102 mmol/L (ref 96–106)
Creatinine, Ser: 1.2 mg/dL (ref 0.76–1.27)
Glucose: 78 mg/dL (ref 70–99)
Potassium: 4.4 mmol/L (ref 3.5–5.2)
Sodium: 141 mmol/L (ref 134–144)
eGFR: 58 mL/min/{1.73_m2} — ABNORMAL LOW (ref >59)

## 2020-12-03 LAB — CBC
Hematocrit: 38.5 % (ref 37.5–51.0)
Hemoglobin: 12.8 g/dL — ABNORMAL LOW (ref 13.0–17.7)
MCH: 33.2 pg — ABNORMAL HIGH (ref 26.6–33.0)
MCHC: 33.2 g/dL (ref 31.5–35.7)
MCV: 100 fL — ABNORMAL HIGH (ref 79–97)
Platelets: 184 10*3/uL (ref 150–450)
RBC: 3.85 x10E6/uL — ABNORMAL LOW (ref 4.14–5.80)
RDW: 12.3 % (ref 11.6–15.4)
WBC: 3.8 10*3/uL (ref 3.4–10.8)

## 2020-12-04 ENCOUNTER — Ambulatory Visit: Payer: Medicare Other | Admitting: Cardiology

## 2020-12-04 ENCOUNTER — Other Ambulatory Visit: Payer: Self-pay

## 2020-12-04 ENCOUNTER — Encounter: Payer: Self-pay | Admitting: Cardiology

## 2020-12-04 VITALS — BP 130/74 | HR 71 | Ht 72.0 in | Wt 162.4 lb

## 2020-12-04 DIAGNOSIS — Z7901 Long term (current) use of anticoagulants: Secondary | ICD-10-CM

## 2020-12-04 DIAGNOSIS — I739 Peripheral vascular disease, unspecified: Secondary | ICD-10-CM | POA: Diagnosis not present

## 2020-12-04 DIAGNOSIS — I480 Paroxysmal atrial fibrillation: Secondary | ICD-10-CM | POA: Diagnosis not present

## 2020-12-04 NOTE — Patient Instructions (Addendum)
Medication Instructions:  Your physician recommends that you continue on your current medications as directed. Please refer to the Current Medication list given to you today.  *If you need a refill on your cardiac medications before your next appointment, please call your pharmacy*   Lab Work: None If you have labs (blood work) drawn today and your tests are completely normal, you will receive your results only by: [place] (if you have MyChart) OR A paper copy in the mail If you have any lab test that is abnormal or we need to change your treatment, we will call you to review the results.   Testing/Procedures: Your physician has requested that you have an ankle brachial index (ABI). During this test an ultrasound and blood pressure cuff are used to evaluate the arteries that supply the arms and legs with blood. Allow thirty minutes for this exam. There are no restrictions or special instructions.    Follow-Up: At [facility], you and your health needs are our priority.  As part of our continuing mission to provide you with exceptional heart care, we have created designated Provider Care Teams.  These Care Teams include your primary Cardiologist (physician) and Advanced Practice Providers (APPs -  Physician Assistants and Nurse Practitioners) who all work together to provide you with the care you need, when you need it.  We recommend signing up for the patient portal called "MyChart".  Sign up information is provided on this After Visit Summary.  MyChart is used to connect with patients for Virtual Visits (Telemedicine).  Patients are able to view lab/test results, encounter notes, upcoming appointments, etc.  Non-urgent messages can be sent to your provider as well.   To learn more about what you can do with MyChart, go to [email or web address].    Your next appointment:   1 year(s)  The format for your next appointment:   In Person  Provider:   Shirlee More, MD     Other Instructions

## 2020-12-04 NOTE — Progress Notes (Signed)
Cardiology Office Note:    Date:  12/04/2020   ID:  Jason Mcconnell, DOB 06-27-1930, MRN [id number]  PCP:  Nicoletta Dress, MD  Cardiologist:  Shirlee More, MD    Referring MD: Nicoletta Dress, MD    ASSESSMENT:    1. Paroxysmal atrial fibrillation (HCC)   2. Chronic anticoagulation   3. Claudication in peripheral vascular disease ([place])    PLAN:    In order of problems listed above:  He continues to do well maintaining sinus rhythm clinically he has a prescription for Cardizem to take as needed for rapid heart rate continue his anticoagulant.  At this time I do not think he needs an antibiotic arrhythmic drug He is having calf pain and some variable pattern that hurts when he walks uphill he is does not need to stop and rest and he has a history of previous back pain unsure if this is claudication or pseudoclaudication and after discussion with the patient he wants to have ABIs performed.   Next appointment: 1 year   Medication Adjustments/Labs and Tests Ordered: Current medicines are reviewed at length with the patient today.  Concerns regarding medicines are outlined above.  Orders Placed This Encounter  Procedures   EKG 12-Lead   VAS [place] ABI WITH/WO TBI   No orders of the defined types were placed in this encounter.   No chief complaint on file.   History of Present Illness:    Jason Mcconnell is a 87 y.o. male with a hx of paroxysmal atrial fibrillation chronically anticoagulated and coronary artery calcification last seen 11/01/2019 maintaining sinus rhythm without an antiarrhythmic drug.  Compliance with diet, lifestyle and medications: Yes  Several months ago he had a respiratory infection he had a Kenalog injection afterwards he had short brief episodes of rapid heart rate he took Cardizem on a few occasions not severe or sustained. He is recovering no shortness of breath edema chest pain palpitation or syncope since No bleeding with his  anticoagulant Past Medical History:  Diagnosis Date   Arthritis    Atrial fibrillation ([place])    a. Dx 08/02/18 at [facility]   Back pain    Coronary artery calcification seen on CT scan    EPIDIDYMAL CYST 12/19/2006   Qualifier: Diagnosis of  By: Linda Hedges MD, Heinz Knuckles    Fracture of distal end of radius 11/12/2017   GERD (gastroesophageal reflux disease)    Ingrown nail 07/24/2015   Other malaise and fatigue 09/13/2007   Qualifier: Diagnosis of  By: Linda Hedges MD, Heinz Knuckles    Pain in soft tissues of limb 07/24/2015   Wrist pain 08/21/2018    Past Surgical History:  Procedure Laterality Date   CHOLECYSTECTOMY     right wrist frac      Current Medications: Current Meds  Medication Sig   diltiazem (CARDIZEM) 30 MG tablet Take 30 mg by mouth 2 (two) times daily as needed (Increase heart rate).   ELIQUIS 5 MG TABS tablet TAKE 1 TABLET BY MOUTH TWICE DAILY   omeprazole (PRILOSEC) 20 MG capsule Take 20 mg by mouth daily as needed (Acid Refluc).     Allergies:   Patient has no known allergies.   Social History   Socioeconomic History   Marital status: Married    Spouse name: Not on file   Number of children: Not on file   Years of education: Not on file   Highest education level: Not on file  Occupational History  Not on file  Tobacco Use   Smoking status: Former    Types: Cigarettes    Quit date: 08/20/1988    Years since quitting: 32.3   Smokeless tobacco: Former    Types: Nurse, children's Use: Never used  Substance and Sexual Activity   Alcohol use: Yes    Comment: Very Seldom   Drug use: Never   Sexual activity: Not on file  Other Topics Concern   Not on file  Social History Narrative   Not on file   Social Determinants of Health   Financial Resource Strain: Not on file  Food Insecurity: Not on file  Transportation Needs: Not on file  Physical Activity: Not on file  Stress: Not on file  Social Connections: Not on file     Family History: The  patient's family history includes Alcohol abuse in his father; COPD in his mother; Cancer in his maternal grandmother; Parkinson's disease in his sister. ROS:   Please see the history of present illness.    All other systems reviewed and are negative.  EKGs/Labs/Other Studies Reviewed:    The following studies were reviewed today:  EKG:  EKG ordered today and personally reviewed.  The ekg ordered today demonstrates sinus rhythm and normal 06/06/2020: Cholesterol 142 LDL 89 triglycerides 77 HDL 38 A1c 6.0 creatinine 1.22 Recent Labs: 12/02/2020: BUN 16; Creatinine, Ser 1.20; Hemoglobin 12.8; Platelets 184; Potassium 4.4; Sodium 141     Physical Exam:    VS:  BP 130/74   Pulse 71   Ht 6' (1.829 m)   Wt 162 lb 6.4 oz (73.7 kg)   SpO2 99%   BMI 22.03 kg/m     Wt Readings from Last 3 Encounters:  12/04/20 162 lb 6.4 oz (73.7 kg)  11/01/19 165 lb (74.8 kg)  10/24/18 172 lb 9.6 oz (78.3 kg)     GEN: He looks younger than his age well nourished, well developed in no acute distress HEENT: Normal NECK: No JVD; No carotid bruits LYMPHATICS: No lymphadenopathy CARDIAC: RRR, no murmurs, rubs, gallops RESPIRATORY:  Clear to auscultation without rales, wheezing or rhonchi  ABDOMEN: Soft, non-tender, non-distended MUSCULOSKELETAL:  No edema; No deformity  SKIN: Warm and dry NEUROLOGIC:  Alert and oriented x 3 PSYCHIATRIC:  Normal affect    Signed, Shirlee More, MD  12/04/2020 2:07 PM    [place]

## 2020-12-05 ENCOUNTER — Telehealth: Payer: Self-pay

## 2020-12-05 DIAGNOSIS — E538 Deficiency of other specified B group vitamins: Secondary | ICD-10-CM | POA: Diagnosis not present

## 2020-12-05 DIAGNOSIS — R718 Other abnormality of red blood cells: Secondary | ICD-10-CM

## 2020-12-05 NOTE — Telephone Encounter (Signed)
Spoke with patient regarding results and recommendation.  Patient verbalizes understanding and is agreeable to plan of care. Advised patient to call back with any issues or concerns.  

## 2020-12-05 NOTE — Telephone Encounter (Signed)
-----   Message from Richardo Priest, MD sent at 12/04/2020  5:51 PM EST ----- Results are good hemoglobin is normal but his red cells are increased in size sometimes tells [place] about problems like U59 folic acid deficiency and I would ask him to return to have a B12 and folate level performed.  If he like he can just wait and discuss with Dr. Delena Bali at his next visit please send a copy to his office

## 2020-12-07 LAB — FOLATE: Folate: 15.6 ng/mL (ref >3.0)

## 2020-12-07 LAB — VITAMIN B12: Vitamin B-12: 310 pg/mL (ref 232–1245)

## 2020-12-08 ENCOUNTER — Telehealth: Payer: Self-pay

## 2020-12-08 NOTE — Telephone Encounter (Signed)
Spoke with patients wife regarding results and recommendation. ° °She verbalizes understanding and is agreeable to plan of care. Advised patient to call back with any issues or concerns.  °

## 2020-12-08 NOTE — Telephone Encounter (Signed)
-----   Message from Richardo Priest, MD sent at 12/07/2020 11:59 AM EST ----- Both P79 and folic acid are normal

## 2020-12-13 LAB — VITAMIN B12

## 2020-12-13 LAB — FOLATE

## 2020-12-15 ENCOUNTER — Telehealth: Payer: Self-pay | Admitting: Cardiology

## 2020-12-15 NOTE — Telephone Encounter (Signed)
Patient said his wife got a call from [facility], but he was not sure what it was about. He was just returning her call

## 2020-12-15 NOTE — Telephone Encounter (Signed)
Patient wife informed of results per dpr.

## 2021-01-01 DIAGNOSIS — B351 Tinea unguium: Secondary | ICD-10-CM | POA: Diagnosis not present

## 2021-01-01 DIAGNOSIS — M79674 Pain in right toe(s): Secondary | ICD-10-CM | POA: Diagnosis not present

## 2021-01-01 DIAGNOSIS — M205X1 Other deformities of toe(s) (acquired), right foot: Secondary | ICD-10-CM | POA: Diagnosis not present

## 2021-01-07 ENCOUNTER — Telehealth: Payer: Self-pay

## 2021-01-07 ENCOUNTER — Other Ambulatory Visit: Payer: Self-pay

## 2021-01-07 ENCOUNTER — Ambulatory Visit (INDEPENDENT_AMBULATORY_CARE_PROVIDER_SITE_OTHER): Payer: Medicare Other

## 2021-01-07 DIAGNOSIS — I739 Peripheral vascular disease, unspecified: Secondary | ICD-10-CM | POA: Diagnosis not present

## 2021-01-07 NOTE — Telephone Encounter (Signed)
-----   Message from Richardo Priest, MD sent at 01/07/2021 11:05 AM EST ----- Normal, I suspect his leg pain is due to back problems pseudo or false claudication.

## 2021-01-07 NOTE — Telephone Encounter (Signed)
Spoke with patient regarding results and recommendation.  Patient verbalizes understanding and is agreeable to plan of care. Advised patient to call back with any issues or concerns.  

## 2021-01-22 DIAGNOSIS — S61401A Unspecified open wound of right hand, initial encounter: Secondary | ICD-10-CM | POA: Diagnosis not present

## 2021-01-22 DIAGNOSIS — Z23 Encounter for immunization: Secondary | ICD-10-CM | POA: Diagnosis not present

## 2021-02-12 ENCOUNTER — Encounter (INDEPENDENT_AMBULATORY_CARE_PROVIDER_SITE_OTHER): Payer: Medicare Other | Admitting: Ophthalmology

## 2021-02-12 ENCOUNTER — Other Ambulatory Visit: Payer: Self-pay

## 2021-02-12 DIAGNOSIS — H43813 Vitreous degeneration, bilateral: Secondary | ICD-10-CM | POA: Diagnosis not present

## 2021-02-12 DIAGNOSIS — H34812 Central retinal vein occlusion, left eye, with macular edema: Secondary | ICD-10-CM | POA: Diagnosis not present

## 2021-02-12 DIAGNOSIS — H353132 Nonexudative age-related macular degeneration, bilateral, intermediate dry stage: Secondary | ICD-10-CM | POA: Diagnosis not present

## 2021-02-26 DIAGNOSIS — C44311 Basal cell carcinoma of skin of nose: Secondary | ICD-10-CM | POA: Diagnosis not present

## 2021-02-26 DIAGNOSIS — L570 Actinic keratosis: Secondary | ICD-10-CM | POA: Diagnosis not present

## 2021-03-25 ENCOUNTER — Telehealth: Payer: Self-pay | Admitting: Cardiology

## 2021-03-25 ENCOUNTER — Other Ambulatory Visit: Payer: Self-pay | Admitting: Cardiology

## 2021-03-25 NOTE — Telephone Encounter (Signed)
Recommendations reviewed with pt as per Dr. [name]'s note.  Pt verbalized understanding and had no additional questions. Routed to PCP  

## 2021-03-25 NOTE — Telephone Encounter (Signed)
Spoke with pt who states that it is more noticeable when he gets up and starts walking he has the dizziness and he stops and stands in place for a min and it resolves. Advised pt to keep a log of his BP and let us know what they are next week. I also advised pt to change positions slowly and stand for a minute before walking. Pt verbalized understanding and had no additional questions. ?

## 2021-03-25 NOTE — Telephone Encounter (Signed)
STAT if patient feels like he/she is going to faint  ? ?Are you dizzy now? no ? ?Do you feel faint or have you passed out? no ? ?Do you have any other symptoms? no ? ?Have you checked your HR and BP (record if available)? HR 60's resting ? ? ?Patient states he has been having dizziness for a couple of months. He says it is not all the time and not everyday.  ?

## 2021-03-26 NOTE — Telephone Encounter (Signed)
Prescription refill request for Eliquis received. ?Indication:Afib ?Last office visit:11/22 ?Scr:1.2 ?Age: 88 ?Weight:73.7 kg ? ?Prescription refilled ? ?

## 2021-05-04 ENCOUNTER — Encounter (HOSPITAL_COMMUNITY): Payer: Self-pay | Admitting: Emergency Medicine

## 2021-05-04 ENCOUNTER — Emergency Department (HOSPITAL_COMMUNITY): Payer: Medicare Other

## 2021-05-04 ENCOUNTER — Emergency Department (HOSPITAL_COMMUNITY)
Admission: EM | Admit: 2021-05-04 | Discharge: 2021-05-05 | Disposition: A | Discharge status: Home / Self Care | Payer: Medicare Other | Attending: Emergency Medicine | Admitting: Emergency Medicine

## 2021-05-04 ENCOUNTER — Other Ambulatory Visit: Payer: Self-pay

## 2021-05-04 DIAGNOSIS — R29818 Other symptoms and signs involving the nervous system: Secondary | ICD-10-CM | POA: Diagnosis not present

## 2021-05-04 DIAGNOSIS — Z7901 Long term (current) use of anticoagulants: Secondary | ICD-10-CM | POA: Diagnosis not present

## 2021-05-04 DIAGNOSIS — I6523 Occlusion and stenosis of bilateral carotid arteries: Secondary | ICD-10-CM | POA: Diagnosis not present

## 2021-05-04 DIAGNOSIS — R200 Anesthesia of skin: Secondary | ICD-10-CM | POA: Diagnosis not present

## 2021-05-04 DIAGNOSIS — G459 Transient cerebral ischemic attack, unspecified: Secondary | ICD-10-CM

## 2021-05-04 DIAGNOSIS — R4701 Aphasia: Secondary | ICD-10-CM | POA: Insufficient documentation

## 2021-05-04 DIAGNOSIS — I672 Cerebral atherosclerosis: Secondary | ICD-10-CM | POA: Diagnosis not present

## 2021-05-04 DIAGNOSIS — I639 Cerebral infarction, unspecified: Secondary | ICD-10-CM | POA: Diagnosis not present

## 2021-05-04 LAB — CBC
HCT: 38.3 % — ABNORMAL LOW (ref 39.0–52.0)
Hemoglobin: 12.3 g/dL — ABNORMAL LOW (ref 13.0–17.0)
MCH: 32.3 pg (ref 26.0–34.0)
MCHC: 32.1 g/dL (ref 30.0–36.0)
MCV: 100.5 fL — ABNORMAL HIGH (ref 80.0–100.0)
Platelets: 163 10*3/uL (ref 150–400)
RBC: 3.81 MIL/uL — ABNORMAL LOW (ref 4.22–5.81)
RDW: 13.5 % (ref 11.5–15.5)
WBC: 3.4 10*3/uL — ABNORMAL LOW (ref 4.0–10.5)
nRBC: 0 % (ref 0.0–0.2)

## 2021-05-04 LAB — DIFFERENTIAL
Abs Immature Granulocytes: 0.04 10*3/uL (ref 0.00–0.07)
Basophils Absolute: 0 10*3/uL (ref 0.0–0.1)
Basophils Relative: 1 %
Eosinophils Absolute: 0.1 10*3/uL (ref 0.0–0.5)
Eosinophils Relative: 2 %
Immature Granulocytes: 1 %
Lymphocytes Relative: 26 %
Lymphs Abs: 0.9 10*3/uL (ref 0.7–4.0)
Monocytes Absolute: 0.3 10*3/uL (ref 0.1–1.0)
Monocytes Relative: 8 %
Neutro Abs: 2.1 10*3/uL (ref 1.7–7.7)
Neutrophils Relative %: 62 %

## 2021-05-04 LAB — COMPREHENSIVE METABOLIC PANEL
ALT: 13 U/L (ref 0–44)
AST: 22 U/L (ref 15–41)
Albumin: 3.8 g/dL (ref 3.5–5.0)
Alkaline Phosphatase: 80 U/L (ref 38–126)
Anion gap: 7 (ref 5–15)
BUN: 16 mg/dL (ref 8–23)
CO2: 26 mmol/L (ref 22–32)
Calcium: 9.2 mg/dL (ref 8.9–10.3)
Chloride: 102 mmol/L (ref 98–111)
Creatinine, Ser: 1.17 mg/dL (ref 0.61–1.24)
GFR, Estimated: 60 mL/min — ABNORMAL LOW (ref >60)
Glucose, Bld: 115 mg/dL — ABNORMAL HIGH (ref 70–99)
Potassium: 4.3 mmol/L (ref 3.5–5.1)
Sodium: 135 mmol/L (ref 135–145)
Total Bilirubin: 1 mg/dL (ref 0.3–1.2)
Total Protein: 6.9 g/dL (ref 6.5–8.1)

## 2021-05-04 LAB — I-STAT CHEM 8, ED
BUN: 17 mg/dL (ref 8–23)
Calcium, Ion: 1.13 mmol/L — ABNORMAL LOW (ref 1.15–1.40)
Chloride: 101 mmol/L (ref 98–111)
Creatinine, Ser: 1.2 mg/dL (ref 0.61–1.24)
Glucose, Bld: 107 mg/dL — ABNORMAL HIGH (ref 70–99)
HCT: 39 % (ref 39.0–52.0)
Hemoglobin: 13.3 g/dL (ref 13.0–17.0)
Potassium: 4.4 mmol/L (ref 3.5–5.1)
Sodium: 137 mmol/L (ref 135–145)
TCO2: 26 mmol/L (ref 22–32)

## 2021-05-04 LAB — APTT: aPTT: 32 seconds (ref 24–36)

## 2021-05-04 LAB — PROTIME-INR
INR: 1.2 (ref 0.8–1.2)
Prothrombin Time: 15 seconds (ref 11.4–15.2)

## 2021-05-04 MED ORDER — SODIUM CHLORIDE 0.9% FLUSH: 3.0000 mL | Freq: Once | INTRAVENOUS | Status: DC

## 2021-05-04 NOTE — ED Provider Triage Note (Addendum)
Emergency Medicine Provider Triage Evaluation Note ? ?MAZIN Jason Mcconnell , a 88 y.o. male  was evaluated in triage.  Pt complains of intermittent episodes of mouth numbness and R 1st and 2nd digit numbness x 3 weeks. Pt reports today at work at 3:30 PM he began experiencing numbness up the right side of his face and slurred speech. It lasted approximately 8-9 minutes before dissipating. Currently pt has no complaints and states his symptoms have resolved. No hx of stroke. Complaint of Eliquis.  ? ?Review of Systems  ?Positive: + numbness, slurred speech ?Negative: - headache, weakness ? ?Physical Exam  ?BP (!) 154/74 (BP Location: Right Arm)   Pulse 64   Temp 98.6 ?F (37 ?C) (Oral)   SpO2 99%  ?Gen:   Awake, no distress   ?Resp:  Normal effort  ?MSK:   Moves extremities without difficulty  ?Other:  R sided facial droop. Remainder of CN 2-12 intact. Speech clear. No drift. Strength 5/5 to BUE and BLEs.  ? ?Medical Decision Making  ?Medically screening exam initiated at 5:32 PM.  Appropriate orders placed.  Jason Mcconnell was informed that the remainder of the evaluation will be completed by another provider, this initial triage assessment does not replace that evaluation, and the importance of remaining in the ED until their evaluation is complete. ? ?TIA? Not currently having symptoms.  ? ?  ?Jason Maize, PA-C ?05/04/21 1735 ? ?

## 2021-05-04 NOTE — ED Triage Notes (Signed)
C/o intermittent numbness to R side of mouth and R thumb and index finger x 3 weeks.  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 ?

## 2021-05-05 ENCOUNTER — Emergency Department (HOSPITAL_COMMUNITY): Payer: Medicare Other

## 2021-05-05 DIAGNOSIS — I672 Cerebral atherosclerosis: Secondary | ICD-10-CM | POA: Diagnosis not present

## 2021-05-05 DIAGNOSIS — I6523 Occlusion and stenosis of bilateral carotid arteries: Secondary | ICD-10-CM | POA: Diagnosis not present

## 2021-05-05 DIAGNOSIS — I639 Cerebral infarction, unspecified: Secondary | ICD-10-CM | POA: Diagnosis not present

## 2021-05-05 DIAGNOSIS — R200 Anesthesia of skin: Secondary | ICD-10-CM | POA: Diagnosis not present

## 2021-05-05 LAB — CBG MONITORING, ED: Glucose-Capillary: 89 mg/dL (ref 70–99)

## 2021-05-05 MED ORDER — IOHEXOL 350 MG/ML SOLN
75.0000 mL | Freq: Once | INTRAVENOUS | Status: AC | PRN
  Administered 2021-05-05: 75 mL via INTRAVENOUS

## 2021-05-05 NOTE — ED Notes (Signed)
Pt transported to CT ?

## 2021-05-05 NOTE — ED Notes (Signed)
Patient verbalizes understanding of discharge instructions. Opportunity for questioning and answers were provided. Armband removed by staff, pt discharged from ED. Pt taken to ED entrance via wheel chair.  

## 2021-05-06 ENCOUNTER — Telehealth: Payer: Self-pay | Admitting: Cardiology

## 2021-05-06 ENCOUNTER — Encounter: Payer: Self-pay | Admitting: Neurology

## 2021-05-06 NOTE — Telephone Encounter (Signed)
Patient was in the emergency room on 4/14 for a TIA, he wants to know if he needs to follow up with Dr. Bettina Gavia.   ?

## 2021-05-06 NOTE — Telephone Encounter (Signed)
Recommendations reviewed with pt as per Dr. [name]'s note.  Pt verbalized understanding and had no additional questions.  

## 2021-05-06 NOTE — ED Provider Notes (Signed)
?[facility] ?Provider Note ? ? ?CSN: [id number] ?Arrival date & time: 05/04/21  1643 ? ?  ? ?History ? ?Chief Complaint  ?Patient presents with  ? Aphasia  ? Numbness  ? ? ?Jason Mcconnell is a 88 y.o. male. ? ?88 year old male with past medical history of atrial fibrillation on Eliquis presents to the ER today with a few weeks of symptoms.  Sounds like it started with right-sided perioral numbness that lasted a couple minutes 3 weeks ago.  No other associated symptoms.  Since that time he had 3 other episodes of that numbness that was also associated with right thumb and first finger numbness.  No paresthesias.  These all resolved with in the matter of minutes.  Today he was at work and had the same episode but this time is also associated with facial droop and difficulty speaking.  He states that he could understand the question and try and answer the question but could not make his mouth form the words that he needs today.  Once again this lasted about a minute or less and he is back to normal at this time.  There is some note of a possible right facial droop but the family says that his face looks normal to them and he states he feels normal.  No other numbness or weakness or paresthesias currently. ? ? ? ?  ? ?Home Medications ?Prior to Admission medications   ?Medication Sig Start Date End Date Taking? Authorizing Provider  ?apixaban (ELIQUIS) 5 MG TABS tablet TAKE 1 TABLET BY MOUTH TWICE DAILY 03/26/21   Richardo Priest, MD  ?diltiazem (CARDIZEM) 30 MG tablet Take 30 mg by mouth 2 (two) times daily as needed (Increase heart rate).    [provider]  ?omeprazole (PRILOSEC) 20 MG capsule Take 20 mg by mouth daily as needed (Acid Refluc).    [provider]  ?   ? ?Allergies    ?Patient has no known allergies.   ? ?Review of Systems   ?Review of Systems ? ?Physical Exam ?Updated Vital Signs ?BP 138/74   Pulse (!) 59   Temp 97.8 ?F (36.6 ?C) (Oral)    Resp 14   SpO2 98%  ?Physical Exam ?Vitals and nursing note reviewed.  ?Constitutional:   ?   Appearance: He is well-developed.  ?HENT:  ?   Head: Normocephalic and atraumatic.  ?   Mouth/Throat:  ?   Mouth: Mucous membranes are moist.  ?   Pharynx: Oropharynx is clear.  ?Eyes:  ?   Pupils: Pupils are equal, round, and reactive to light.  ?Cardiovascular:  ?   Rate and Rhythm: Normal rate.  ?Pulmonary:  ?   Effort: Pulmonary effort is normal. No respiratory distress.  ?Abdominal:  ?   General: Abdomen is flat. There is no distension.  ?Musculoskeletal:     ?   General: Normal range of motion.  ?   Cervical back: Normal range of motion.  ?Skin: ?   General: Skin is warm and dry.  ?Neurological:  ?   General: No focal deficit present.  ?   Mental Status: He is alert.  ?   Comments: Symmetric grip strength, arm flexion and extension, symmetric foot dorsiflexion and plantarflexion, leg raise to gravity.  Sensations intact throughout the face, upper and lower extremities.  Does have a somewhat flattened right nasolabial fold however his smile is symmetric and his tongue protrudes midline and uvula raises symmetrically.  No other  facial asymmetry.  ?Psychiatric:     ?   Mood and Affect: Mood normal.  ? ? ?ED Results / Procedures / Treatments   ?Labs ?(all labs ordered are listed, but only abnormal results are displayed) ?Labs Reviewed  ?CBC - Abnormal; Notable for the following components:  ?    Result Value  ? WBC 3.4 (*)   ? RBC 3.81 (*)   ? Hemoglobin 12.3 (*)   ? HCT 38.3 (*)   ? MCV 100.5 (*)   ? All other components within normal limits  ?COMPREHENSIVE METABOLIC PANEL - Abnormal; Notable for the following components:  ? Glucose, Bld 115 (*)   ? GFR, Estimated 60 (*)   ? All other components within normal limits  ?I-STAT CHEM 8, ED - Abnormal; Notable for the following components:  ? Glucose, Bld 107 (*)   ? Calcium, Ion 1.13 (*)   ? All other components within normal limits  ?PROTIME-INR  ?APTT  ?DIFFERENTIAL  ?CBG  MONITORING, ED  ? ? ?EKG ?EKG Interpretation ? ?Date/Time:  Monday May 04 2021 17:06:49 EDT ?Ventricular Rate:  66 ?PR Interval:  200 ?QRS Duration: 86 ?QT Interval:  394 ?QTC Calculation: 413 ?R Axis:   41 ?Text Interpretation: Normal sinus rhythm Nonspecific ST abnormality Abnormal ECG No previous ECGs available Confirmed by Merrily Pew [phone]) on 05/04/2021 11:49:57 PM ? ?Radiology ?CT ANGIO HEAD NECK W WO CM ? ?Result Date: 05/05/2021 ?CLINICAL DATA:  Intermittent numbness to right side of mouth and right thumb/index finger for 3 weeks EXAM: CT ANGIOGRAPHY HEAD AND NECK TECHNIQUE: Multidetector CT imaging of the head and neck was performed using the standard protocol during bolus administration of intravenous contrast. Multiplanar CT image reconstructions and MIPs were obtained to evaluate the vascular anatomy. Carotid stenosis measurements (when applicable) are obtained utilizing NASCET criteria, using the distal internal carotid diameter as the denominator. RADIATION DOSE REDUCTION: This exam was performed according to the departmental dose-optimization program which includes automated exposure control, adjustment of the mA and/or kV according to patient size and/or use of iterative reconstruction technique. CONTRAST:  25m OMNIPAQUE IOHEXOL 350 MG/ML SOLN COMPARISON:  Brain MRI from earlier the same day FINDINGS: CTA NECK FINDINGS Aortic arch: No acute finding or dilatation.  Three vessel branching Right carotid system: Mixed density plaque at the bifurcation and proximal ICA without flow reducing stenosis or ulceration. Left carotid system: Mixed density plaque at the bifurcation and proximal ICA without flow reducing stenosis or ulceration. Vertebral arteries: No proximal subclavian stenosis. Left dominant vertebral artery. Skeleton: No acute or aggressive finding negative Other neck: Negative Upper chest: Biapical pleural based scarring.  No emergent finding Review of the MIP images confirms the above  findings CTA HEAD FINDINGS Anterior circulation: Mild atheromatous plaque at the siphons. No branch occlusion, beading, or aneurysm. Posterior circulation: Left dominant vertebral artery. No branch occlusion, beading, or aneurysm. Venous sinuses: Unremarkable for arterial phase Anatomic variants: None significant Review of the MIP images confirms the above findings IMPRESSION: 1. No emergent finding. 2. Age expected atherosclerosis without flow limiting stenosis or ulceration of major vessels. Electronically Signed   By: JJorje GuildM.D.   On: 05/05/2021 04:38  ? ?CT HEAD WO CONTRAST ? ?Result Date: 05/04/2021 ?CLINICAL DATA:  Neuro deficit, acute, stroke suspected EXAM: CT HEAD WITHOUT CONTRAST TECHNIQUE: Contiguous axial images were obtained from the base of the skull through the vertex without intravenous contrast. RADIATION DOSE REDUCTION: This exam was performed according to the departmental dose-optimization program which  includes automated exposure control, adjustment of the mA and/or kV according to patient size and/or use of iterative reconstruction technique. COMPARISON:  None. BRAIN: BRAIN Cerebral ventricle sizes are concordant with the degree of mild cerebral volume loss. Trace patchy areas of decreased attenuation are noted throughout the deep and periventricular white matter of the cerebral hemispheres bilaterally, compatible with chronic microvascular ischemic disease. No evidence of large-territorial acute infarction. No parenchymal hemorrhage. No mass lesion. No extra-axial collection. No mass effect or midline shift. No hydrocephalus. Basilar cisterns are patent. Vascular: No hyperdense vessel. Skull: No acute fracture or focal lesion. Sinuses/Orbits: Paranasal sinuses and mastoid air cells are clear. Bilateral lens replacement. Otherwise orbits are unremarkable. Other: None. IMPRESSION: No acute intracranial abnormality. Electronically Signed   By: Iven Finn M.D.   On: 05/04/2021 20:37   ? ?MR BRAIN WO CONTRAST ? ?Result Date: 05/05/2021 ?CLINICAL DATA:  Stroke follow-up EXAM: MRI HEAD WITHOUT CONTRAST TECHNIQUE: Multiplanar, multiecho pulse sequences of the brain and surrounding structures

## 2021-05-07 ENCOUNTER — Encounter (INDEPENDENT_AMBULATORY_CARE_PROVIDER_SITE_OTHER): Payer: Medicare Other | Admitting: Ophthalmology

## 2021-05-07 DIAGNOSIS — H35032 Hypertensive retinopathy, left eye: Secondary | ICD-10-CM | POA: Diagnosis not present

## 2021-05-07 DIAGNOSIS — H43813 Vitreous degeneration, bilateral: Secondary | ICD-10-CM

## 2021-05-07 DIAGNOSIS — I10 Essential (primary) hypertension: Secondary | ICD-10-CM | POA: Diagnosis not present

## 2021-05-07 DIAGNOSIS — H353132 Nonexudative age-related macular degeneration, bilateral, intermediate dry stage: Secondary | ICD-10-CM

## 2021-05-07 DIAGNOSIS — H34812 Central retinal vein occlusion, left eye, with macular edema: Secondary | ICD-10-CM

## 2021-05-11 NOTE — Progress Notes (Addendum)
? ?NEUROLOGY CONSULTATION NOTE ? ?Jason Mcconnell ?MRN: [id number] ?DOB: 04-22-1930 ? ?Referring provider: Merrily Pew, MD ?Primary care provider: Nelda Bucks, MD ? ?Reason for consult:  TIA ? ?Assessment/Plan:  ? ?Recurrent transient symptoms correlating with left MCA territory.  What makes this unusual for TIA is that he has had recurrent habitual spells for 4 weeks now in absence of a focal arterial stenosis.  Furthermore, if he was having recurrent TIAs to the same location of the brain, I would suspect that he would have had a full stroke by now.  Other considerations include focal onset seizures without impaired consciousness or atypical migraine. ?Atrial fibrillation, on Eliquis ? ?Check routine EEG.  If unremarkable, schedule 72 hour ambulatory EEG ?Check lipid panel and Hgb A1c ?Echocardiogram not warranted as it would not change management ?Cardiology may consider switching Eliquis to another anticoagulant but that would be only a lateral change. ?Follow up after testing. ? ? ? ? ?Subjective:  ?Jason Mcconnell is an 88 year old right-handed male with a fib who presents for TIA.  History supplemented by hospital notes.  CT head, MRI brain, CTA head and neck personally reviewed.  He is accompanied by his daughter who supplements history. ? ?In late March, he had an episode of right sided perioral numbness lasting 5 minutes.  Over the next couple of weeks, he had 3 additional episodes that were also accompanied by right thumb and index finger numbness. On 4/17, he had a similar episode but this time associated with right facial droop and difficulty getting words out.  He was evaluated in the [facility] ED.  CT and MRI of brain showed chronic small vessel ischemic changes but no acute stroke.  CTA head and neck showed mild atherosclerosis in the bilateral carotid arteries but no LVO or hemodynamically significant stenosis.  He is already on Eliquis for atrial fibrillation.  Since then he had had 3  or 4 additional episodes.  The last one occurred yesterday where the speech disturbance preceded onset of the numbness.   ? ?Denies history of TIA, migraines or seizures. ?  ? ? ?PAST MEDICAL HISTORY: ?Past Medical History:  ?Diagnosis Date  ? Arthritis   ? Atrial fibrillation ([place])   ? a. Dx 08/02/18 at [facility]  ? Back pain   ? Coronary artery calcification seen on CT scan   ? EPIDIDYMAL CYST 12/19/2006  ? Qualifier: Diagnosis of  By: Linda Hedges MD, Heinz Knuckles   ? Fracture of distal end of radius 11/12/2017  ? GERD (gastroesophageal reflux disease)   ? Ingrown nail 07/24/2015  ? Other malaise and fatigue 09/13/2007  ? Qualifier: Diagnosis of  By: Linda Hedges MD, Heinz Knuckles   ? Pain in soft tissues of limb 07/24/2015  ? Wrist pain 08/21/2018  ? ? ?PAST SURGICAL HISTORY: ?Past Surgical History:  ?Procedure Laterality Date  ? CHOLECYSTECTOMY    ? right wrist frac    ? ? ?MEDICATIONS: ?Current Outpatient Medications on File Prior to Visit  ?Medication Sig Dispense Refill  ? apixaban (ELIQUIS) 5 MG TABS tablet TAKE 1 TABLET BY MOUTH TWICE DAILY 180 tablet 1  ? diltiazem (CARDIZEM) 30 MG tablet Take 30 mg by mouth 2 (two) times daily as needed (Increase heart rate).    ? omeprazole (PRILOSEC) 20 MG capsule Take 20 mg by mouth daily as needed (Acid Refluc).    ? ?No current facility-administered medications on file prior to visit.  ? ? ?ALLERGIES: ?No Known Allergies ? ?FAMILY HISTORY: ?  Family History  ?Problem Relation Age of Onset  ? COPD Mother   ? Alcohol abuse Father   ? Parkinson's disease Sister   ? Cancer Maternal Grandmother   ? ? ?Objective:  ?Blood pressure 137/61, pulse 70, height 6' (1.829 m), weight 160 lb 6.4 oz (72.8 kg), SpO2 98 %. ?General: No acute distress.  Patient appears well-groomed.   ?Head:  Normocephalic/atraumatic ?Eyes:  fundi examined but not visualized ?Neck: supple, no paraspinal tenderness, full range of motion ?Back: No paraspinal tenderness ?Heart: regular rate and rhythm ?Lungs: Clear to  auscultation bilaterally. ?Vascular: No carotid bruits. ?Neurological Exam: ?Mental status: alert and oriented to person, place, and time, recent and remote memory intact, fund of knowledge intact, attention and concentration intact, speech fluent and not dysarthric, language intact. ?Cranial nerves: ?CN I: not tested ?CN II: pupils equal, round and reactive to light, visual fields intact ?CN III, IV, VI:  full range of motion, no nystagmus, no ptosis ?CN V: facial sensation intact. ?CN VII: Mild right lower facial droop ?CN VIII: hearing intact ?CN IX, X: gag intact, uvula midline ?CN XI: sternocleidomastoid and trapezius muscles intact ?CN XII: tongue midline ?Bulk & Tone: normal, no fasciculations. ?Motor:  muscle strength 5/5 throughout ?Sensation:  Pinprick, temperature and vibratory sensation intact. ?Deep Tendon Reflexes:  2+ throughout,  toes downgoing.   ?Finger to nose testing:  Without dysmetria.   ?Heel to shin:  Without dysmetria.   ?Gait:  Normal station and stride.  Romberg negative. ? ? ? ?Thank you for allowing me to take part in the care of this patient. ? ?Metta Clines, DO ? ?CC: Nelda Bucks, MD ? ? ? ? ?

## 2021-05-12 ENCOUNTER — Encounter: Payer: Self-pay | Admitting: Neurology

## 2021-05-12 ENCOUNTER — Other Ambulatory Visit (INDEPENDENT_AMBULATORY_CARE_PROVIDER_SITE_OTHER): Payer: Medicare Other

## 2021-05-12 ENCOUNTER — Ambulatory Visit: Payer: Medicare Other | Admitting: Neurology

## 2021-05-12 VITALS — BP 137/61 | HR 70 | Ht 72.0 in | Wt 160.4 lb

## 2021-05-12 DIAGNOSIS — R2990 Unspecified symptoms and signs involving the nervous system: Secondary | ICD-10-CM

## 2021-05-12 DIAGNOSIS — R739 Hyperglycemia, unspecified: Secondary | ICD-10-CM | POA: Diagnosis not present

## 2021-05-12 LAB — LIPID PANEL
Cholesterol: 133 mg/dL (ref 0–200)
HDL: 40.3 mg/dL (ref >39.00)
LDL Cholesterol: 79 mg/dL (ref 0–99)
NonHDL: 92.98
Total CHOL/HDL Ratio: 3
Triglycerides: 71 mg/dL (ref 0.0–149.0)
VLDL: 14.2 mg/dL (ref 0.0–40.0)

## 2021-05-12 LAB — HEMOGLOBIN A1C: Hgb A1c MFr Bld: 6 % (ref 4.6–6.5)

## 2021-05-12 NOTE — Patient Instructions (Addendum)
Unusual presentation for TIAs given that you have had recurrent episodes of same symptoms ongoing for a month now.  Would also consider atypical seizures or migraine ? ? ?1,  Check routine EEG.  If normal, then will check 72 hour ambulatory EEG ?2.  Check lipid panel and Hgb A1c ?3.  Follow up after testing. ?

## 2021-05-13 DIAGNOSIS — K5900 Constipation, unspecified: Secondary | ICD-10-CM | POA: Diagnosis not present

## 2021-05-13 DIAGNOSIS — K219 Gastro-esophageal reflux disease without esophagitis: Secondary | ICD-10-CM | POA: Diagnosis not present

## 2021-05-13 DIAGNOSIS — R109 Unspecified abdominal pain: Secondary | ICD-10-CM | POA: Diagnosis not present

## 2021-05-15 ENCOUNTER — Ambulatory Visit: Payer: Medicare Other | Admitting: Neurology

## 2021-05-15 DIAGNOSIS — R2990 Unspecified symptoms and signs involving the nervous system: Secondary | ICD-10-CM | POA: Diagnosis not present

## 2021-05-18 ENCOUNTER — Telehealth: Payer: Self-pay

## 2021-05-18 DIAGNOSIS — R2990 Unspecified symptoms and signs involving the nervous system: Secondary | ICD-10-CM

## 2021-05-18 NOTE — Telephone Encounter (Signed)
-----   Message from Pieter Partridge, DO sent at 05/18/2021 10:14 AM EDT ----- ?EEG normal.  I would like to proceed with 72 hour ambulatory eeg ?

## 2021-05-18 NOTE — Telephone Encounter (Signed)
Patient advised of his EEG results. EEG amb ordered. Pateint Jason Mcconnell will call to schedule. ?

## 2021-05-18 NOTE — Procedures (Signed)
ELECTROENCEPHALOGRAM REPORT ? ?Date of Study: 05/15/2021 ? ?Patient's Name: Jason Mcconnell ?MRN: [id number] ?Date of Birth: [birth year] ? ? ?Clinical History: 88 year old male with a fib who has had several episodes of right sided facial/hand numbness and speech disturbance. ? ?Medications: ?Eliquis ?Cardizem ? ?Technical Summary: ?A multichannel digital EEG recording measured by the international 10-20 system with electrodes applied with paste and impedances below 5000 ohms performed in our laboratory with EKG monitoring in an awake and drowsy patient.  Hyperventilation and photic stimulation were performed.  The digital EEG was referentially recorded, reformatted, and digitally filtered in a variety of bipolar and referential montages for optimal display.   ? ?Description: ?The patient is awake and drowsy during the recording.  During maximal wakefulness, there is a symmetric, medium voltage 9 Hz posterior dominant rhythm that attenuates with eye opening.  The record is symmetric.  Stage 2 sleep was not seen.  Hyperventilation and photic stimulation did not elicit any abnormalities.  There were no epileptiform discharges or electrographic seizures seen.   ? ?EKG lead was unremarkable. ? ?Impression: ?This awake and drowsy EEG is normal.   ? ?Clinical Correlation: ?A normal EEG does not exclude a clinical diagnosis of epilepsy.  If further clinical questions remain, prolonged EEG may be helpful.  Clinical correlation is advised. ? ? ?Metta Clines, DO ? ?

## 2021-06-08 ENCOUNTER — Ambulatory Visit: Payer: Medicare Other | Admitting: Neurology

## 2021-06-08 DIAGNOSIS — R2990 Unspecified symptoms and signs involving the nervous system: Secondary | ICD-10-CM | POA: Diagnosis not present

## 2021-06-15 NOTE — Procedures (Signed)
ELECTROENCEPHALOGRAM REPORT  Dates of Recording: 06/08/2021 at 7:34 AM to 06/11/2021 at 7:44 AM  Patient's Name: Jason Mcconnell MRN: [id number] Date of Birth: November 06, 1930  Procedure: 68-hour ambulatory EEG  History: 88 year old male with atrial fibrillation recurrent episodes of right sided facial numbness, facial droop and expressive aphasia.  Medications: Eliquis, Cardizem, Prilosec  Technical Summary: This is a 68-hour multichannel digital EEG recording measured by the international 10-20 system with electrodes applied with paste and impedances below 5000 ohms performed as portable with EKG monitoring.  The digital EEG was referentially recorded, reformatted, and digitally filtered in a variety of bipolar and referential montages for optimal display.    DESCRIPTION OF RECORDING: During maximal wakefulness, the background activity consisted of a symmetric '9Hz'$  posterior dominant rhythm which was reactive to eye opening.  There were no epileptiform discharges or focal slowing seen in wakefulness.  During the recording, the patient progresses through wakefulness, drowsiness, and Stage 2 sleep.  Again, there were no epileptiform discharges seen.  Events: Day 1 at 9:45 AM - right perioral pain and numbness Day 2 at 5:43 AM - numbness of first 3 fingers moving to 5th finger Day 2 at 7:25 AM - numbness in right index finger Day 3 at 4:30 PM - numbness in first 4 fingers of right hand  There were no electrographic seizures seen.  EKG lead was unremarkable.  IMPRESSION: This 68-hour ambulatory EEG study is normal.    CLINICAL CORRELATION: Patient reported habitual spells without electrographic correlate for seizures.   Metta Clines, DO

## 2021-06-18 ENCOUNTER — Other Ambulatory Visit: Payer: Self-pay

## 2021-06-18 ENCOUNTER — Telehealth: Payer: Self-pay

## 2021-06-18 MED ORDER — LEVETIRACETAM 500 MG PO TABS: 500.0000 mg | ORAL_TABLET | Freq: Two times a day (BID) | ORAL | 2 refills | Status: DC

## 2021-06-19 ENCOUNTER — Telehealth: Payer: Self-pay | Admitting: Neurology

## 2021-06-19 DIAGNOSIS — Z0000 Encounter for general adult medical examination without abnormal findings: Secondary | ICD-10-CM | POA: Diagnosis not present

## 2021-06-19 NOTE — Telephone Encounter (Signed)
Per patient, He got the VM from [place]. Thank you.

## 2021-06-19 NOTE — Telephone Encounter (Signed)
Pt called in stating he got a missed call from our office. 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

## 2021-06-22 ENCOUNTER — Encounter: Payer: Self-pay | Admitting: Neurology

## 2021-06-22 NOTE — Telephone Encounter (Signed)
Pt letter has been mailed out.  Talked with wife

## 2021-06-22 NOTE — Telephone Encounter (Signed)
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 The e-mail is [email or web address] if he can get one.

## 2021-06-25 DIAGNOSIS — K219 Gastro-esophageal reflux disease without esophagitis: Secondary | ICD-10-CM | POA: Diagnosis not present

## 2021-06-25 DIAGNOSIS — K5900 Constipation, unspecified: Secondary | ICD-10-CM | POA: Diagnosis not present

## 2021-06-25 DIAGNOSIS — R109 Unspecified abdominal pain: Secondary | ICD-10-CM | POA: Diagnosis not present

## 2021-06-26 ENCOUNTER — Telehealth: Payer: Self-pay

## 2021-06-26 NOTE — Telephone Encounter (Signed)
[facility] called back and said that the seizures made his right hand fingers became numb and also the side of hand. Also effected his speech.The medicine just made him feel odd.

## 2021-06-26 NOTE — Telephone Encounter (Signed)
Jason Mcconnell called and reported that he took the [place] you prescribed for a day and a half and he was not feeling well so he halved the pill and took half two time one day and still not feeling well. Then he stopped taking it and the next day he has a seizure that lasted for 1 minuet

## 2021-06-29 ENCOUNTER — Telehealth: Payer: Self-pay

## 2021-06-29 MED ORDER — LAMOTRIGINE 25 MG PO TABS: ORAL_TABLET | ORAL | 3 refills | Status: DC

## 2021-06-29 NOTE — Telephone Encounter (Signed)
Sent in Medicine to [facility] pharmacy

## 2021-06-29 NOTE — Telephone Encounter (Signed)
Telephone call to [facility], please cancel refills attached to the Lamotrigine 25 mg script.

## 2021-07-08 DIAGNOSIS — D72819 Decreased white blood cell count, unspecified: Secondary | ICD-10-CM | POA: Diagnosis not present

## 2021-07-10 DIAGNOSIS — D72819 Decreased white blood cell count, unspecified: Secondary | ICD-10-CM | POA: Diagnosis not present

## 2021-07-10 DIAGNOSIS — R0981 Nasal congestion: Secondary | ICD-10-CM | POA: Diagnosis not present

## 2021-07-13 ENCOUNTER — Telehealth: Payer: Self-pay | Admitting: Neurology

## 2021-07-13 NOTE — Telephone Encounter (Signed)
Pt called in wanting to speak with someone about his Keppra. He stays tired all the time and says his white blood cells are low.

## 2021-07-14 ENCOUNTER — Other Ambulatory Visit: Payer: Self-pay | Admitting: Neurology

## 2021-07-14 MED ORDER — LACOSAMIDE 50 MG PO TABS: 50.0000 mg | ORAL_TABLET | Freq: Two times a day (BID) | ORAL | 5 refills | Status: DC

## 2021-07-29 ENCOUNTER — Other Ambulatory Visit: Payer: Self-pay | Admitting: Oncology

## 2021-07-29 NOTE — Progress Notes (Signed)
[facility] [facility]  [address] [place],    [address] [phone]  Clinic Day:  07/30/2021  Referring physician: Nicoletta Dress, MD   HISTORY OF PRESENT ILLNESS:  The patient is an 88 y.o. male who I was asked to consult upon for leukopenia.  Recent labs at his primary care office showed a low white count of 2.7.  According to the patient, he denies having a history of leukopenia.  He actually recalls having a CBC being done at his primary care office due to him having increased fatigue.  He denies having any B symptoms which concern him for his leukopenia being due to an underlying hematologic malignancy.  The patient recently started Keppra 2 months ago to prevent further seizures.  He denies being placed on any other new medicines or being on any chronic medicines which are known to cause leukopenia via bone marrow suppression.  To his knowledge, there is no family history of leukopenia or other hematologic disorders.  PAST MEDICAL HISTORY:   Past Medical History:  Diagnosis Date   Arthritis    Atrial fibrillation ([place])    a. Dx 08/02/18 at [facility]   Back pain    Coronary artery calcification seen on CT scan    EPIDIDYMAL CYST 12/19/2006   Qualifier: Diagnosis of  By: Linda Hedges MD, Heinz Knuckles    Fracture of distal end of radius 11/12/2017   GERD (gastroesophageal reflux disease)    Ingrown nail 07/24/2015   Other malaise and fatigue 09/13/2007   Qualifier: Diagnosis of  By: Linda Hedges MD, Heinz Knuckles    Pain in soft tissues of limb 07/24/2015   Wrist pain 08/21/2018    PAST SURGICAL HISTORY:   Past Surgical History:  Procedure Laterality Date   CARDIAC CATHETERIZATION Bilateral    CATARACT EXTRACTION Bilateral    CHOLECYSTECTOMY     MENISCUS REPAIR Right    POLYPECTOMY     right wrist frac     UMBILICAL HERNIA REPAIR      CURRENT MEDICATIONS:   Current Outpatient Medications  Medication Sig Dispense Refill   levETIRAcetam (KEPPRA)  500 MG tablet Take 1 tablet (500 mg total) by mouth 2 (two) times daily. 60 tablet 2   apixaban (ELIQUIS) 5 MG TABS tablet TAKE 1 TABLET BY MOUTH TWICE DAILY 180 tablet 1   omeprazole (PRILOSEC) 20 MG capsule Take 20 mg by mouth daily as needed (Acid Refluc).     No current facility-administered medications for this visit.   ALLERGIES:  No Known Allergies  FAMILY HISTORY:   Family History  Problem Relation Age of Onset   COPD Mother    Alcohol abuse Father    Heart failure Father    Ovarian cancer Maternal Grandmother     SOCIAL HISTORY:  The patient was born and raised in [place].  He currently lives in town with his wife of 35 years.  They have 3 children, 6 grandchildren, and 6 great-grandchildren.  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  He did smoke as much as a half a pack of cigarettes daily for 27 years before quitting over 40 years ago.  There is a remote history of beer consumption.  REVIEW OF SYSTEMS:  Review of Systems  Constitutional:  Positive for fatigue. Negative for fever and unexpected weight change.  Eyes:  Positive for eye problems.  Respiratory:  Positive for cough. Negative for chest  tightness, hemoptysis and shortness of breath.   Cardiovascular:  Negative for chest pain and palpitations.  Gastrointestinal:  Positive for constipation. Negative for abdominal distention, abdominal pain, blood in stool, diarrhea, nausea and vomiting.  Genitourinary:  Negative for dysuria, frequency and hematuria.   Musculoskeletal:  Positive for gait problem. Negative for arthralgias, back pain and myalgias.  Skin:  Negative for itching and rash.  Neurological:  Positive for gait problem. Negative for dizziness, headaches and light-headedness.  Psychiatric/Behavioral:  Negative for depression and suicidal ideas. The patient is not nervous/anxious.    PHYSICAL EXAM:  Blood pressure (!) 148/85, pulse 60,  temperature 97.9 F (36.6 C), resp. rate 16, height 6' (1.829 m), weight 156 lb 4.8 oz (70.9 kg), SpO2 98 %. Wt Readings from Last 3 Encounters:  07/30/21 156 lb 4.8 oz (70.9 kg)  05/12/21 160 lb 6.4 oz (72.8 kg)  12/04/20 162 lb 6.4 oz (73.7 kg)   Body mass index is 21.2 kg/m. Performance status (ECOG): 1 - Symptomatic but completely ambulatory Physical Exam Constitutional:      Appearance: Normal appearance. He is not ill-appearing.  HENT:     Mouth/Throat:     Mouth: Mucous membranes are moist.     Pharynx: Oropharynx is clear. No oropharyngeal exudate or posterior oropharyngeal erythema.  Cardiovascular:     Rate and Rhythm: Normal rate and regular rhythm.     Heart sounds: No murmur heard.    No friction rub. No gallop.  Pulmonary:     Effort: Pulmonary effort is normal. No respiratory distress.     Breath sounds: Normal breath sounds. No wheezing, rhonchi or rales.  Abdominal:     General: Bowel sounds are normal. There is no distension.     Palpations: Abdomen is soft. There is no mass.     Tenderness: There is no abdominal tenderness.  Musculoskeletal:        General: No swelling.     Right lower leg: No edema.     Left lower leg: No edema.  Lymphadenopathy:     Cervical: No cervical adenopathy.     Upper Body:     Right upper body: No supraclavicular or axillary adenopathy.     Left upper body: No supraclavicular or axillary adenopathy.     Lower Body: No right inguinal adenopathy. No left inguinal adenopathy.  Skin:    General: Skin is warm.     Coloration: Skin is not jaundiced.     Findings: No lesion or rash.  Neurological:     General: No focal deficit present.     Mental Status: He is alert and oriented to person, place, and time. Mental status is at baseline.  Psychiatric:        Mood and Affect: Mood normal.        Behavior: Behavior normal.        Thought Content: Thought content normal.     LABS:       Latest Ref Rng & Units 07/30/2021    12:00 AM 05/04/2021    6:20 PM 05/04/2021    5:47 PM  CMP  Glucose 70 - 99 mg/dL  107  115   BUN 4 - _0 Creatinine 0.6 - 1.3 1.2     1.20  1.17   Sodium 137 - 147 136     137  135   Potassium 3.5 - 5.1 mEq/L 4.1     4.4  4.3  Chloride 99 - 108 104     101  102   CO2 13 - _0 Calcium 8.7 - 10.7 8.7      9.2   Total Protein 6.5 - 8.1 g/dL   6.9   Total Bilirubin 0.3 - 1.2 mg/dL   1.0   Alkaline Phos 25 - 125 90      80   AST 14 - 40 24      22   ALT 10 - 40 U/L 15      13      This result is from an external source.   ASSESSMENT & PLAN:  An 88 y.o. male who I was asked to consult upon for leukopenia.  His labs today also show mild evidence of anemia.  I will check his iron, vitamin B12, and folate levels to ensure there are no nutritional deficiencies factoring into his cytopenias.  When evaluating his comprehensive metabolic panel, none of his liver enzymes is elevated which concerns me for underlying liver disease factoring into his cytopenias.  I will check a TSH level to ensure severe thyroid disease is not factoring into his cytopenias.  A serum protein electrophoresis will also be done to ensure an underlying plasma cell dyscrasia is not factoring into his cytopenias.  As he is 88 years old and has 2 cell lines that are low, this raises the suspicion for possible myelodysplasia, for which a bone marrow biopsy would ultimately be needed to prove.  Overall, none of his counts is dangerously low to where immediate intervention is necessary.  I will see this patient back in 1 month to go over all of his labs collected today, which will be used to formulate his next course of action.  The patient understands all the plans discussed today and is in agreement with them.  I do appreciate Nicoletta Dress, MD for his new consult.   Taten Merrow Macarthur Critchley, MD

## 2021-07-30 ENCOUNTER — Inpatient Hospital Stay: Payer: Medicare Other | Attending: Oncology | Admitting: Oncology

## 2021-07-30 ENCOUNTER — Other Ambulatory Visit: Payer: Self-pay | Admitting: Hematology and Oncology

## 2021-07-30 ENCOUNTER — Inpatient Hospital Stay: Payer: Medicare Other

## 2021-07-30 ENCOUNTER — Encounter: Payer: Self-pay | Admitting: Oncology

## 2021-07-30 ENCOUNTER — Other Ambulatory Visit: Payer: Self-pay | Admitting: Oncology

## 2021-07-30 VITALS — BP 148/85 | HR 60 | Temp 97.9°F | Resp 16 | Ht 72.0 in | Wt 156.3 lb

## 2021-07-30 DIAGNOSIS — D72819 Decreased white blood cell count, unspecified: Secondary | ICD-10-CM | POA: Insufficient documentation

## 2021-07-30 DIAGNOSIS — D649 Anemia, unspecified: Secondary | ICD-10-CM | POA: Insufficient documentation

## 2021-07-30 DIAGNOSIS — R5381 Other malaise: Secondary | ICD-10-CM | POA: Diagnosis not present

## 2021-07-30 LAB — HEPATIC FUNCTION PANEL
ALT: 15 U/L (ref 10–40)
AST: 24 (ref 14–40)
Alkaline Phosphatase: 90 (ref 25–125)
Bilirubin, Total: 0.9

## 2021-07-30 LAB — BASIC METABOLIC PANEL
BUN: 17 (ref 4–21)
CO2: 26 — AB (ref 13–22)
Chloride: 104 (ref 99–108)
Creatinine: 1.2 (ref 0.6–1.3)
Glucose: 84
Potassium: 4.1 mEq/L (ref 3.5–5.1)
Sodium: 136 — AB (ref 137–147)

## 2021-07-30 LAB — IRON AND TIBC
Iron: 119 ug/dL (ref 45–182)
Saturation Ratios: 41 % — ABNORMAL HIGH (ref 17.9–39.5)
TIBC: 294 ug/dL (ref 250–450)
UIBC: 175 ug/dL

## 2021-07-30 LAB — CBC AND DIFFERENTIAL
HCT: 34 — AB (ref 41–53)
Hemoglobin: 11.3 — AB (ref 13.5–17.5)
Neutrophils Absolute: 1.1
Platelets: 144 10*3/uL — AB (ref 150–400)
WBC: 2.2

## 2021-07-30 LAB — FOLATE: Folate: 10.2 ng/mL (ref >5.9)

## 2021-07-30 LAB — COMPREHENSIVE METABOLIC PANEL
Albumin: 3.6 (ref 3.5–5.0)
Calcium: 8.7 (ref 8.7–10.7)

## 2021-07-30 LAB — CBC
MCV: 97 — AB (ref 80–94)
RBC: 3.48 — AB (ref 3.87–5.11)

## 2021-07-30 LAB — TSH: TSH: 1.618 u[IU]/mL (ref 0.350–4.500)

## 2021-07-30 LAB — VITAMIN B12: Vitamin B-12: 241 pg/mL (ref 180–914)

## 2021-07-30 LAB — FERRITIN: Ferritin: 116 ng/mL (ref 24–336)

## 2021-08-02 ENCOUNTER — Other Ambulatory Visit: Payer: Self-pay | Admitting: Oncology

## 2021-08-02 NOTE — Progress Notes (Incomplete)
[facility]  [address] [place],  [place]  [address] [phone]  Clinic Day:  07/30/2021  Referring physician: Nelda Bucks   HISTORY OF PRESENT ILLNESS:  The patient is a 88 y.o. male who I was asked to consult upon for leukopenia.  Recent labs showed a low white count of 2.7.   According to the patient, he had never been told before of having leukopenia.  In fact, the patient claims lab work at his primary care office was recently done due to him having increased fatigue.  He denies having any B symptoms which concern him for his leukopenia being due to to an underlying hematologic disorder the only new medicine he is started recently his Keppra, which was done for seizure prevention.  He denies being on any other medicines, particularly any that are known to cause leukopenia via bone marrow suppression.  To his knowledge, there is no family history of leukopenia or other hematologic disorders.  PAST MEDICAL HISTORY:   Past Medical History:  Diagnosis Date  . Arthritis   . Atrial fibrillation ([place])    a. Dx 08/02/18 at [facility]  . Back pain   . Coronary artery calcification seen on CT scan   . EPIDIDYMAL CYST 12/19/2006   Qualifier: Diagnosis of  By: Linda Hedges MD, Heinz Knuckles   . Fracture of distal end of radius 11/12/2017  . GERD (gastroesophageal reflux disease)   . Ingrown nail 07/24/2015  . Other malaise and fatigue 09/13/2007   Qualifier: Diagnosis of  By: Linda Hedges MD, Heinz Knuckles   . Pain in soft tissues of limb 07/24/2015  . Wrist pain 08/21/2018    PAST SURGICAL HISTORY:   Past Surgical History:  Procedure Laterality Date  . CARDIAC CATHETERIZATION Bilateral   . CATARACT EXTRACTION Bilateral   . CHOLECYSTECTOMY    . MENISCUS REPAIR Right   . POLYPECTOMY    . right wrist frac    . UMBILICAL HERNIA REPAIR      CURRENT MEDICATIONS:   Current Outpatient Medications  Medication Sig Dispense Refill  . levETIRAcetam (KEPPRA) 500  MG tablet Take 1 tablet (500 mg total) by mouth 2 (two) times daily. 60 tablet 2  . apixaban (ELIQUIS) 5 MG TABS tablet TAKE 1 TABLET BY MOUTH TWICE DAILY 180 tablet 1  . omeprazole (PRILOSEC) 20 MG capsule Take 20 mg by mouth daily as needed (Acid Refluc).     No current facility-administered medications for this visit.   ALLERGIES:  No Known Allergies  FAMILY HISTORY:   Family History  Problem Relation Age of Onset  . COPD Mother   . Alcohol abuse Father   . Heart failure Father   . Ovarian cancer Maternal Grandmother     SOCIAL HISTORY:  The patient was born and raised in [place].  He currently lives in town with his wife of 71 years.  They have 3 children, 6 grandchildren, and 6 great-grandchildren.  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  He did smoke a half a pack of cigarettes daily for 27 years before quitting over 40 years ago.  He drank beer in the remote past.  REVIEW OF SYSTEMS:  Review of Systems  Constitutional:  Positive for fatigue. Negative for fever and unexpected weight change.  Eyes:  Positive for eye problems.  Respiratory:  Positive for cough. Negative for chest tightness, hemoptysis and shortness of breath.   Cardiovascular:  Negative for chest pain and palpitations.  Gastrointestinal:  Positive for constipation. Negative for abdominal distention, abdominal pain, blood in stool, diarrhea, nausea and vomiting.  Genitourinary:  Negative for dysuria, frequency and hematuria.   Musculoskeletal:  Positive for gait problem. Negative for arthralgias, back pain and myalgias.  Skin:  Negative for itching and rash.  Neurological:  Positive for gait problem. Negative for dizziness, headaches and light-headedness.  Psychiatric/Behavioral:  Negative for depression and suicidal ideas. The patient is not nervous/anxious.    PHYSICAL EXAM:  Vital signs include a weight of 156 pounds, temperature 97.9, pulse 60,  respiratory rate 16, blood pressure 148/85 Wt Readings from Last 3 Encounters:  07/30/21 156 lb 4.8 oz (70.9 kg)  05/12/21 160 lb 6.4 oz (72.8 kg)  12/04/20 162 lb 6.4 oz (73.7 kg)   There is no height or weight on file to calculate BMI. Performance status (ECOG): 1 - Symptomatic but completely ambulatory Physical Exam  LABS:      Latest Ref Rng & Units 07/30/2021   12:00 AM 05/04/2021    6:20 PM 05/04/2021    5:47 PM  CBC  WBC  2.2      3.4   Hemoglobin 13.5 - 17.5 11.3     13.3  12.3   Hematocrit 41 - 53 34     39.0  38.3   Platelets 150 - 400 K/uL 144      163      This result is from an external source.      Latest Ref Rng & Units 07/30/2021   12:00 AM 05/04/2021    6:20 PM 05/04/2021    5:47 PM  CMP  Glucose 70 - 99 mg/dL  107  115   BUN 4 - '21 17     17  16   '$ Creatinine 0.6 - 1.3 1.2     1.20  1.17   Sodium 137 - 147 136     137  135   Potassium 3.5 - 5.1 mEq/L 4.1     4.4  4.3   Chloride 99 - 108 104     101  102   CO2 13 - '22 26      26   '$ Calcium 8.7 - 10.7 8.7      9.2   Total Protein 6.5 - 8.1 g/dL   6.9   Total Bilirubin 0.3 - 1.2 mg/dL   1.0   Alkaline Phos 25 - 125 90      80   AST 14 - 40 24      22   ALT 10 - 40 U/L 15      13      This result is from an external source.     No results found for: "CEA1", "CEA" / No results found for: "CEA1", "CEA" No results found for: "PSA1" No results found for: "FWY637" No results found for: "CAN125"  No results found for: "TOTALPROTELP", "ALBUMINELP", "A1GS", "A2GS", "BETS", "BETA2SER", "GAMS", "MSPIKE", "SPEI" Lab Results  Component Value Date   TIBC 294 07/30/2021   FERRITIN 116 07/30/2021   IRONPCTSAT 41 (H) 07/30/2021   No results found for: "LDH"  No results found for: "AFPTUMOR", "TOTALPROTELP", "ALBUMINELP", "A1GS", "A2GS", "BETS", "BETA2SER", "GAMS", "MSPIKE", "SPEI", "LDH", "CEA1", "CEA", "PSA1", "IGASERUM", "IGGSERUM", "IGMSERUM", "THGAB", "THYROGLB"  Review Flowsheet       Latest Ref Rng &  Units 07/30/2021  Oncology Labs  Ferritin 24 - 336 ng/mL 116   %SAT 17.9 - 39.5 %  41    STUDIES:  No results found.   ASSESSMENT & PLAN:  A 88 y.o. male who I was asked to consult upon for *** .The patient understands all the plans discussed today and is in agreement with them.  I do appreciate No ref. provider found for his new consult.   May Ozment Macarthur Critchley, MD

## 2021-08-03 LAB — PROTEIN ELECTROPHORESIS, SERUM
A/G Ratio: 1.3 (ref 0.7–1.7)
Albumin ELP: 3.3 g/dL (ref 2.9–4.4)
Alpha-1-Globulin: 0.2 g/dL (ref 0.0–0.4)
Alpha-2-Globulin: 0.7 g/dL (ref 0.4–1.0)
Beta Globulin: 0.8 g/dL (ref 0.7–1.3)
Gamma Globulin: 0.8 g/dL (ref 0.4–1.8)
Globulin, Total: 2.6 g/dL (ref 2.2–3.9)
Total Protein ELP: 5.9 g/dL — ABNORMAL LOW (ref 6.0–8.5)

## 2021-08-13 ENCOUNTER — Encounter (INDEPENDENT_AMBULATORY_CARE_PROVIDER_SITE_OTHER): Payer: Medicare Other | Admitting: Ophthalmology

## 2021-08-13 DIAGNOSIS — H353132 Nonexudative age-related macular degeneration, bilateral, intermediate dry stage: Secondary | ICD-10-CM

## 2021-08-13 DIAGNOSIS — H43813 Vitreous degeneration, bilateral: Secondary | ICD-10-CM | POA: Diagnosis not present

## 2021-08-13 DIAGNOSIS — H34812 Central retinal vein occlusion, left eye, with macular edema: Secondary | ICD-10-CM | POA: Diagnosis not present

## 2021-08-20 DIAGNOSIS — Z9181 History of falling: Secondary | ICD-10-CM | POA: Diagnosis not present

## 2021-08-20 DIAGNOSIS — Z0000 Encounter for general adult medical examination without abnormal findings: Secondary | ICD-10-CM | POA: Diagnosis not present

## 2021-08-20 DIAGNOSIS — Z139 Encounter for screening, unspecified: Secondary | ICD-10-CM | POA: Diagnosis not present

## 2021-09-02 ENCOUNTER — Other Ambulatory Visit: Payer: Medicare Other

## 2021-09-02 ENCOUNTER — Ambulatory Visit: Payer: Medicare Other | Admitting: Oncology

## 2021-09-02 NOTE — Progress Notes (Signed)
[facility]  [address] [place],  [place]  [address] [phone]  Clinic Day:  09/03/2021  Referring physician: Nicoletta Dress, MD   HISTORY OF PRESENT ILLNESS:  The patient is an 88 y.o. male who I recently casting for leukopenia.  He comes in today to go over all of his recent labs to determine the etiology behind this.  Since his last visit, the patient has been doing fairly well.  He continues to deny having any spontaneous infections related to his low white count.  He denies being placed on any new medicines which have been ability to cause leukopenia via bone marrow suppression.  PHYSICAL EXAM:  Blood pressure 133/63, pulse 66, temperature 98.3 F (36.8 C), resp. rate 16, height 6' (1.829 m), weight 157 lb 1.6 oz (71.3 kg), SpO2 99 %. Wt Readings from Last 3 Encounters:  09/03/21 157 lb 1.6 oz (71.3 kg)  07/30/21 156 lb 4.8 oz (70.9 kg)  05/12/21 160 lb 6.4 oz (72.8 kg)   Body mass index is 21.31 kg/m. Performance status (ECOG): 1 - Symptomatic but completely ambulatory Physical Exam Constitutional:      Appearance: Normal appearance. He is not ill-appearing.  HENT:     Mouth/Throat:     Mouth: Mucous membranes are moist.     Pharynx: Oropharynx is clear. No oropharyngeal exudate or posterior oropharyngeal erythema.  Cardiovascular:     Rate and Rhythm: Normal rate and regular rhythm.     Heart sounds: No murmur heard.    No friction rub. No gallop.  Pulmonary:     Effort: Pulmonary effort is normal. No respiratory distress.     Breath sounds: Normal breath sounds. No wheezing, rhonchi or rales.  Abdominal:     General: Bowel sounds are normal. There is no distension.     Palpations: Abdomen is soft. There is no mass.     Tenderness: There is no abdominal tenderness.  Musculoskeletal:        General: No swelling.     Right lower leg: No edema.     Left lower leg: No edema.  Lymphadenopathy:     Cervical: No cervical  adenopathy.     Upper Body:     Right upper body: No supraclavicular or axillary adenopathy.     Left upper body: No supraclavicular or axillary adenopathy.     Lower Body: No right inguinal adenopathy. No left inguinal adenopathy.  Skin:    General: Skin is warm.     Coloration: Skin is not jaundiced.     Findings: No lesion or rash.  Neurological:     General: No focal deficit present.     Mental Status: He is alert and oriented to person, place, and time. Mental status is at baseline.  Psychiatric:        Mood and Affect: Mood normal.        Behavior: Behavior normal.        Thought Content: Thought content normal.    LABS:       Latest Ref Rng & Units 07/30/2021   12:00 AM 05/04/2021    6:20 PM 05/04/2021    5:47 PM  CMP  Glucose 70 - 99 mg/dL  107  115   BUN 4 - '21 17     17  16   '$ Creatinine 0.6 - 1.3 1.2     1.20  1.17   Sodium 137 - 147 136     137  135   Potassium 3.5 - 5.1 mEq/L 4.1     4.4  4.3   Chloride 99 - 108 104     101  102   CO2 13 - '22 26      26   '$ Calcium 8.7 - 10.7 8.7      9.2   Total Protein 6.5 - 8.1 g/dL   6.9   Total Bilirubin 0.3 - 1.2 mg/dL   1.0   Alkaline Phos 25 - 125 90      80   AST 14 - 40 24      22   ALT 10 - 40 U/L 15      13      This result is from an external source.    Latest Reference Range & Units 07/30/21 10:52 07/30/21 11:38  Iron 45 - 182 ug/dL  119  UIBC ug/dL  175  TIBC 250 - 450 ug/dL  294  Saturation Ratios 17.9 - 39.5 %  41 (H)  Ferritin 24 - 336 ng/mL  116  Folate >5.9 ng/mL 10.2   Vitamin B12 180 - 914 pg/mL 241   Total Protein ELP 6.0 - 8.5 g/dL  5.9 (L)  Albumin ELP 2.9 - 4.4 g/dL  3.3  Globulin, Total 2.2 - 3.9 g/dL  2.6 (C)  A/G Ratio 0.7 - 1.7   1.3 (C)  Alpha-1-Globulin 0.0 - 0.4 g/dL  0.2  Alpha-2-Globulin 0.4 - 1.0 g/dL  0.7  Beta Globulin 0.7 - 1.3 g/dL  0.8  Gamma Globulin 0.4 - 1.8 g/dL  0.8  M-SPIKE, % Not Observed g/dL  Not Observed  (H): Data is abnormally high (L): Data is abnormally  low (C): Corrected  ASSESSMENT & PLAN:  An 88 y.o. male who I recently seeing for leukopenia.  He also had a mild degree of anemia.  However, when evaluating his recent labs, he has no evidence of nutritional deficiencies being present.  His serum protein electrophoresis also did not reveal a monoclonal spike, which makes it highly unlikely an underlying plasma cell dyscrasia is present.  His liver enzymes are also normal, which makes it very unlikely underlying hepatosplenic disease is factoring into his leukopenia.  When evaluating his CBC today, I am actually pleased as his white cells and hemoglobin are both improved.  Clinically, the patient appears to be doing fine.  As his peripheral counts are better, he will be followed conservatively for now.  I will see him back in 6 months to reassess his peripheral counts.  The patient understands all the plans discussed today and is in agreement with them.   Jason Ovando Macarthur Critchley, MD

## 2021-09-03 ENCOUNTER — Inpatient Hospital Stay: Payer: Medicare Other | Attending: Oncology

## 2021-09-03 ENCOUNTER — Other Ambulatory Visit: Payer: Self-pay | Admitting: Oncology

## 2021-09-03 ENCOUNTER — Inpatient Hospital Stay: Payer: Medicare Other | Admitting: Oncology

## 2021-09-03 DIAGNOSIS — D72819 Decreased white blood cell count, unspecified: Secondary | ICD-10-CM | POA: Diagnosis not present

## 2021-09-03 DIAGNOSIS — Z79899 Other long term (current) drug therapy: Secondary | ICD-10-CM | POA: Diagnosis not present

## 2021-09-03 DIAGNOSIS — D649 Anemia, unspecified: Secondary | ICD-10-CM | POA: Diagnosis not present

## 2021-09-03 LAB — CBC AND DIFFERENTIAL
HCT: 36 — AB (ref 41–53)
Hemoglobin: 12.2 — AB (ref 13.5–17.5)
Neutrophils Absolute: 1.83
Platelets: 149 10*3/uL — AB (ref 150–400)
WBC: 3

## 2021-09-03 LAB — TSH: TSH: 1.475 u[IU]/mL (ref 0.350–4.500)

## 2021-09-03 LAB — CBC: RBC: 3.7 — AB (ref 3.87–5.11)

## 2021-09-06 DIAGNOSIS — D72819 Decreased white blood cell count, unspecified: Secondary | ICD-10-CM

## 2021-09-06 DIAGNOSIS — D61818 Other pancytopenia: Secondary | ICD-10-CM | POA: Insufficient documentation

## 2021-09-06 HISTORY — DX: Other pancytopenia: D61.818

## 2021-09-06 HISTORY — DX: Decreased white blood cell count, unspecified: D72.819

## 2021-09-30 ENCOUNTER — Other Ambulatory Visit: Payer: Self-pay | Admitting: Cardiology

## 2021-09-30 DIAGNOSIS — I480 Paroxysmal atrial fibrillation: Secondary | ICD-10-CM

## 2021-09-30 NOTE — Telephone Encounter (Addendum)
Eliquis '5mg'$  refill request received. Patient is 88 years old, weight-71.3kg, Crea-1.2 on 07/30/2021, Diagnosis-Afib, and last seen by Dr. Bettina Gavia on 12/04/2020. Dose is appropriate based on dosing criteria. Will send in refill to requested pharmacy.  Discussed eliquis and keppra use with Gerald Stabs, Pharmd and was advised since pt has been on for years without any issues to continue. Refill sent.

## 2021-10-02 ENCOUNTER — Telehealth: Payer: Self-pay | Admitting: Neurology

## 2021-10-02 NOTE — Telephone Encounter (Signed)
Pt wanted to speak with renee, said he would call back if she doesn't call him first.

## 2021-10-05 NOTE — Telephone Encounter (Signed)
Called Patient and informed him of answer Per Dr. Tomi Likens. He understood ,he has not had the numbness in 4 months. He will call back if it starts again.

## 2021-12-03 ENCOUNTER — Encounter (INDEPENDENT_AMBULATORY_CARE_PROVIDER_SITE_OTHER): Payer: Medicare Other | Admitting: Ophthalmology

## 2021-12-03 DIAGNOSIS — H43813 Vitreous degeneration, bilateral: Secondary | ICD-10-CM | POA: Diagnosis not present

## 2021-12-03 DIAGNOSIS — H34812 Central retinal vein occlusion, left eye, with macular edema: Secondary | ICD-10-CM

## 2021-12-03 DIAGNOSIS — H353132 Nonexudative age-related macular degeneration, bilateral, intermediate dry stage: Secondary | ICD-10-CM

## 2021-12-15 DIAGNOSIS — Z23 Encounter for immunization: Secondary | ICD-10-CM | POA: Diagnosis not present

## 2021-12-20 NOTE — Progress Notes (Unsigned)
Cardiology Office Note:    Date:  12/21/2021   ID:  ROYER CRISTOBAL, DOB Jan 22, 1930, MRN [id number]  PCP:  Nicoletta Dress, MD  Cardiologist:  Shirlee More, MD    Referring MD: Nicoletta Dress, MD    ASSESSMENT:    1. Paroxysmal atrial fibrillation (HCC)   2. Chronic anticoagulation   3. Claudication in peripheral vascular disease ([place])   4. Coronary artery calcification seen on CT scan    PLAN:    In order of problems listed above:  I am concerned that his symptoms momentary lightheadedness weakness near syncope is arrhythmic in etiology either bradycardia or short episodes of atrial tachyarrhythmia will apply an event monitor for 2 weeks and asked him to purchase a Fitbit to pair with his phone so he can capture his heart rate during episodes.  Presently not on an antiarrhythmic drug or suppressant therapy. Continue his anticoagulant switch Eliquis to Xarelto Stable claudication presently not occurring Not having anginal discomfort   Next appointment: 6 months   Medication Adjustments/Labs and Tests Ordered: Current medicines are reviewed at length with the patient today.  Concerns regarding medicines are outlined above.  No orders of the defined types were placed in this encounter.  No orders of the defined types were placed in this encounter.   No chief complaint on file.   History of Present Illness:    Jason Mcconnell is a 88 y.o. male with a hx of paroxysmal atrial fibrillation with chronic anticoagulation maintaining sinus rhythm without an antiarrhythmic drug coronary artery calcification and claudication last seen 02/04/2020.  Compliance with diet, lifestyle and medications: Yes  He is a bit unsettled today but once a week he will get an episode where he feels weak at times almost near loss of consciousness it is momentary but afterwards he feels badly for period of time he is concerned he is having trouble with his heart rhythm. I asked him to  purchase a Fitbit smart watch so that he can capture his heart rate during the episodes He is having vague GI distress that he attributes to Eliquis he takes a PPI and working to transition to Xarelto to see if his LDL pathic in nature and whether we can improve his symptoms Otherwise well no exercise intolerance edema shortness of breath chest pain or syncope  06/23/2021 cholesterol 142 LDL 86 A1c 5.9% Past Medical History:  Diagnosis Date   Arthritis    Atrial fibrillation ([place])    a. Dx 08/02/18 at [facility]   Back pain    Coronary artery calcification seen on CT scan    EPIDIDYMAL CYST 12/19/2006   Qualifier: Diagnosis of  By: Linda Hedges MD, Heinz Knuckles    Fracture of distal end of radius 11/12/2017   GERD (gastroesophageal reflux disease)    Ingrown nail 07/24/2015   Other malaise and fatigue 09/13/2007   Qualifier: Diagnosis of  By: Linda Hedges MD, Heinz Knuckles    Pain in soft tissues of limb 07/24/2015   Wrist pain 08/21/2018    Past Surgical History:  Procedure Laterality Date   CARDIAC CATHETERIZATION Bilateral    CATARACT EXTRACTION Bilateral    CHOLECYSTECTOMY     MENISCUS REPAIR Right    POLYPECTOMY     right wrist frac     UMBILICAL HERNIA REPAIR      Current Medications: No outpatient medications have been marked as taking for the 12/21/21 encounter (Office Visit) with Richardo Priest, MD.     Allergies:  Patient has no known allergies.   Social History   Socioeconomic History   Marital status: Married    Spouse name: [name]   Number of children: 3   Years of education: 12   Highest education level: Not on file  Occupational History   Occupation: RETIRED TRUCK DRIVER    Comment: [employer]  Tobacco Use   Smoking status: Former    Types: Cigarettes    Quit date: 08/20/1988    Years since quitting: 33.3   Smokeless tobacco: Former    Types: Nurse, children's Use: Never used  Substance and Sexual Activity   Alcohol use: Yes     Comment: Very Seldom   Drug use: Never   Sexual activity: Not Currently  Other Topics Concern   Not on file  Social History Narrative   Not on file   Social Determinants of Health   Financial Resource Strain: Not on file  Food Insecurity: Not on file  Transportation Needs: Not on file  Physical Activity: Not on file  Stress: Not on file  Social Connections: Not on file     Family History: The patient's family history includes Alcohol abuse in his father; COPD in his mother; Heart failure in his father; Ovarian cancer in his maternal grandmother. ROS:   Please see the history of present illness.    All other systems reviewed and are negative.  EKGs/Labs/Other Studies Reviewed:    The following studies were reviewed today:  Recent Labs: 07/30/2021: ALT 15; BUN 17; Creatinine 1.2; Potassium 4.1; Sodium 136 09/03/2021: Hemoglobin 12.2; Platelets 149; TSH 1.475  Recent Lipid Panel    Component Value Date/Time   CHOL 133 05/12/2021 1344   TRIG 71.0 05/12/2021 1344   HDL 40.30 05/12/2021 1344   CHOLHDL 3 05/12/2021 1344   VLDL 14.2 05/12/2021 1344   LDLCALC 79 05/12/2021 1344  06/19/2021: Cholesterol 142 LDL 86 A1c 5.9% hemoglobin a1c  Physical Exam:    VS:  BP (!) 148/74 (BP Location: Left Arm, Patient Position: Sitting, Cuff Size: Normal)   Pulse 62   Ht 6' (1.829 m)   Wt 155 lb (70.3 kg)   SpO2 97%   BMI 21.02 kg/m     Wt Readings from Last 3 Encounters:  12/21/21 155 lb (70.3 kg)  09/03/21 157 lb 1.6 oz (71.3 kg)  07/30/21 156 lb 4.8 oz (70.9 kg)     GEN:  Well nourished, well developed in no acute distress HEENT: Normal NECK: No JVD; No carotid bruits LYMPHATICS: No lymphadenopathy CARDIAC: RRR, no murmurs, rubs, gallops RESPIRATORY:  Clear to auscultation without rales, wheezing or rhonchi  ABDOMEN: Soft, non-tender, non-distended MUSCULOSKELETAL:  No edema; No deformity  SKIN: Warm and dry NEUROLOGIC:  Alert and oriented x 3 PSYCHIATRIC:  Normal  affect    Signed, Shirlee More, MD  12/21/2021 8:42 AM    [facility]

## 2021-12-21 ENCOUNTER — Ambulatory Visit: Payer: Medicare Other | Attending: Cardiology | Admitting: Cardiology

## 2021-12-21 ENCOUNTER — Ambulatory Visit: Payer: Medicare Other | Attending: Cardiology

## 2021-12-21 ENCOUNTER — Encounter: Payer: Self-pay | Admitting: Cardiology

## 2021-12-21 VITALS — BP 128/72 | HR 62 | Ht 72.0 in | Wt 155.0 lb

## 2021-12-21 DIAGNOSIS — Z7901 Long term (current) use of anticoagulants: Secondary | ICD-10-CM

## 2021-12-21 DIAGNOSIS — I480 Paroxysmal atrial fibrillation: Secondary | ICD-10-CM | POA: Diagnosis not present

## 2021-12-21 DIAGNOSIS — I2510 Atherosclerotic heart disease of native coronary artery without angina pectoris: Secondary | ICD-10-CM | POA: Diagnosis not present

## 2021-12-21 DIAGNOSIS — I739 Peripheral vascular disease, unspecified: Secondary | ICD-10-CM

## 2021-12-21 MED ORDER — RIVAROXABAN 20 MG PO TABS: 20.0000 mg | ORAL_TABLET | Freq: Every day | ORAL | 3 refills | Status: DC

## 2021-12-21 MED ORDER — NITROGLYCERIN 0.4 MG SL SUBL: 0.4000 mg | SUBLINGUAL_TABLET | SUBLINGUAL | 1 refills | Status: DC | PRN

## 2021-12-21 NOTE — Patient Instructions (Signed)
Medication Instructions:  Your physician has recommended you make the following change in your medication:   STOP: Eliquis START: Xarelto 20 mg daily  *If you need a refill on your cardiac medications before your next appointment, please call your pharmacy*   Lab Work: None If you have labs (blood work) drawn today and your tests are completely normal, you will receive your results only by: [place] (if you have MyChart) OR A paper copy in the mail If you have any lab test that is abnormal or we need to change your treatment, we will call you to review the results.   Testing/Procedures: A zio monitor was ordered today. It will remain on for 14 days. You will then return monitor and event diary in provided box. It takes 1-2 weeks for report to be downloaded and returned to [place]. We will call you with the results. If monitor falls off or has orange flashing light, please call Zio for further instructions.     Follow-Up: At [facility], you and your health needs are our priority.  As part of our continuing mission to provide you with exceptional heart care, we have created designated Provider Care Teams.  These Care Teams include your primary Cardiologist (physician) and Advanced Practice Providers (APPs -  Physician Assistants and Nurse Practitioners) who all work together to provide you with the care you need, when you need it.  We recommend signing up for the patient portal called "MyChart".  Sign up information is provided on this After Visit Summary.  MyChart is used to connect with patients for Virtual Visits (Telemedicine).  Patients are able to view lab/test results, encounter notes, upcoming appointments, etc.  Non-urgent messages can be sent to your provider as well.   To learn more about what you can do with MyChart, go to [email or web address].    Your next appointment:   9 month(s)  The format for your next appointment:   In Person  Provider:   Shirlee More, MD    Other Instructions Get a Fitbit watch  Important Information About Sugar

## 2022-01-07 DIAGNOSIS — I480 Paroxysmal atrial fibrillation: Secondary | ICD-10-CM | POA: Diagnosis not present

## 2022-01-07 DIAGNOSIS — I2510 Atherosclerotic heart disease of native coronary artery without angina pectoris: Secondary | ICD-10-CM | POA: Diagnosis not present

## 2022-01-07 DIAGNOSIS — M79675 Pain in left toe(s): Secondary | ICD-10-CM | POA: Diagnosis not present

## 2022-01-07 DIAGNOSIS — M79674 Pain in right toe(s): Secondary | ICD-10-CM | POA: Diagnosis not present

## 2022-01-07 DIAGNOSIS — B351 Tinea unguium: Secondary | ICD-10-CM | POA: Diagnosis not present

## 2022-01-13 ENCOUNTER — Other Ambulatory Visit: Payer: Self-pay

## 2022-01-13 MED ORDER — METOPROLOL SUCCINATE ER 25 MG PO TB24: 25.0000 mg | ORAL_TABLET | Freq: Every day | ORAL | 3 refills | Status: DC

## 2022-01-14 DIAGNOSIS — H524 Presbyopia: Secondary | ICD-10-CM | POA: Diagnosis not present

## 2022-01-14 DIAGNOSIS — H353132 Nonexudative age-related macular degeneration, bilateral, intermediate dry stage: Secondary | ICD-10-CM | POA: Diagnosis not present

## 2022-01-14 DIAGNOSIS — H5203 Hypermetropia, bilateral: Secondary | ICD-10-CM | POA: Diagnosis not present

## 2022-01-14 DIAGNOSIS — Z9849 Cataract extraction status, unspecified eye: Secondary | ICD-10-CM | POA: Diagnosis not present

## 2022-01-14 DIAGNOSIS — Z961 Presence of intraocular lens: Secondary | ICD-10-CM | POA: Diagnosis not present

## 2022-01-14 DIAGNOSIS — H52223 Regular astigmatism, bilateral: Secondary | ICD-10-CM | POA: Diagnosis not present

## 2022-01-25 ENCOUNTER — Telehealth: Payer: Self-pay | Admitting: Cardiology

## 2022-01-25 NOTE — Telephone Encounter (Signed)
Called patient and he reported that his Apple watch was saying that his heart rate was 46 - 48 bpm. He also stated that when he gets up to walk he gets a little light headed and dizzy. He also has to stand and wait for a minute until "he gets his bearings". I advice him to make sure he is not dizzy or light headed when starts to walk so he would not fall. Patient also stated that he had just started Toprol XL 25 mg. Please advise.

## 2022-01-25 NOTE — Telephone Encounter (Signed)
Patient says that Dr. Bettina Gavia told her to get a smartwatch. Patient says that the smartwatch is not registering his heartbeat because its too low. Said that he was put on metoprolol succinate (TOPROL XL) 25 MG 24 hr tablet . Please call back to discuss

## 2022-01-27 NOTE — Telephone Encounter (Signed)
° °  Pt is calling back to f/u °

## 2022-01-28 NOTE — Telephone Encounter (Signed)
Patient is returning call.  °

## 2022-01-28 NOTE — Telephone Encounter (Signed)
Left message for the patient to call back.

## 2022-01-29 ENCOUNTER — Telehealth: Payer: Self-pay | Admitting: Cardiology

## 2022-01-29 ENCOUNTER — Other Ambulatory Visit: Payer: Self-pay

## 2022-01-29 MED ORDER — METOPROLOL SUCCINATE ER 25 MG PO TB24: 12.5000 mg | ORAL_TABLET | Freq: Every day | ORAL | 3 refills | Status: DC

## 2022-01-29 NOTE — Telephone Encounter (Signed)
Called patient and informed them of Dr. Joya Gaskins recommendation below:  "Skip Toprol 1 day, reduce to 1/2 day"  Patient was agreeable with this plan and had no further questions at this time.

## 2022-01-29 NOTE — Telephone Encounter (Signed)
[name]:  1. Name of Medication:  metoprolol succinate (TOPROL XL) 25 MG 24 hr tablet  2. How are you currently taking this medication (dosage and times per day)? Needs to verify dose  3. Are you having a reaction (difficulty breathing--STAT)? no  4. What is your medication issue? Amy with [facility] states the patient was taking 1 whole tablet daily, but the prescription now says half a tablet. She says the quantity still says 90 tablets, so they are calling to verify if the dose is still 1 tablet or a half tablet daily. Phone: [phone]

## 2022-01-29 NOTE — Telephone Encounter (Signed)
Spoke with Mickel Baas at [facility] confirmed that patient was reduced to 1/2 tablet once daily

## 2022-03-10 NOTE — Progress Notes (Unsigned)
[facility]  [address] [place],  [place]  [address] [phone]  Clinic Day:  09/03/2021  Referring physician: Nicoletta Dress, MD   HISTORY OF PRESENT ILLNESS:  The patient is an 89 y.o. male who I recently casting for leukopenia.  He comes in today to go over all of his recent labs to determine the etiology behind this.  Since his last visit, the patient has been doing fairly well.  He continues to deny having any spontaneous infections related to his low white count.  He denies being placed on any new medicines which have been ability to cause leukopenia via bone marrow suppression.  PHYSICAL EXAM:  There were no vitals taken for this visit. Wt Readings from Last 3 Encounters:  12/21/21 155 lb (70.3 kg)  09/03/21 157 lb 1.6 oz (71.3 kg)  07/30/21 156 lb 4.8 oz (70.9 kg)   There is no height or weight on file to calculate BMI. Performance status (ECOG): 1 - Symptomatic but completely ambulatory Physical Exam Constitutional:      Appearance: Normal appearance. He is not ill-appearing.  HENT:     Mouth/Throat:     Mouth: Mucous membranes are moist.     Pharynx: Oropharynx is clear. No oropharyngeal exudate or posterior oropharyngeal erythema.  Cardiovascular:     Rate and Rhythm: Normal rate and regular rhythm.     Heart sounds: No murmur heard.    No friction rub. No gallop.  Pulmonary:     Effort: Pulmonary effort is normal. No respiratory distress.     Breath sounds: Normal breath sounds. No wheezing, rhonchi or rales.  Abdominal:     General: Bowel sounds are normal. There is no distension.     Palpations: Abdomen is soft. There is no mass.     Tenderness: There is no abdominal tenderness.  Musculoskeletal:        General: No swelling.     Right lower leg: No edema.     Left lower leg: No edema.  Lymphadenopathy:     Cervical: No cervical adenopathy.     Upper Body:     Right upper body: No supraclavicular or axillary  adenopathy.     Left upper body: No supraclavicular or axillary adenopathy.     Lower Body: No right inguinal adenopathy. No left inguinal adenopathy.  Skin:    General: Skin is warm.     Coloration: Skin is not jaundiced.     Findings: No lesion or rash.  Neurological:     General: No focal deficit present.     Mental Status: He is alert and oriented to person, place, and time. Mental status is at baseline.  Psychiatric:        Mood and Affect: Mood normal.        Behavior: Behavior normal.        Thought Content: Thought content normal.    LABS:       Latest Ref Rng & Units 07/30/2021   12:00 AM 05/04/2021    6:20 PM 05/04/2021    5:47 PM  CMP  Glucose 70 - 99 mg/dL  107  115   BUN 4 - 21 17     17  16   $ Creatinine 0.6 - 1.3 1.2     1.20  1.17   Sodium 137 - 147 136     137  135   Potassium 3.5 - 5.1 mEq/L 4.1     4.4  4.3  Chloride 99 - 108 104     101  102   CO2 13 - 22 26      26   $ Calcium 8.7 - 10.7 8.7      9.2   Total Protein 6.5 - 8.1 g/dL   6.9   Total Bilirubin 0.3 - 1.2 mg/dL   1.0   Alkaline Phos 25 - 125 90      80   AST 14 - 40 24      22   ALT 10 - 40 U/L 15      13      This result is from an external source.     Latest Reference Range & Units 07/30/21 10:52 07/30/21 11:38  Iron 45 - 182 ug/dL  119  UIBC ug/dL  175  TIBC 250 - 450 ug/dL  294  Saturation Ratios 17.9 - 39.5 %  41 (H)  Ferritin 24 - 336 ng/mL  116  Folate >5.9 ng/mL 10.2   Vitamin B12 180 - 914 pg/mL 241   Total Protein ELP 6.0 - 8.5 g/dL  5.9 (L)  Albumin ELP 2.9 - 4.4 g/dL  3.3  Globulin, Total 2.2 - 3.9 g/dL  2.6 (C)  A/G Ratio 0.7 - 1.7   1.3 (C)  Alpha-1-Globulin 0.0 - 0.4 g/dL  0.2  Alpha-2-Globulin 0.4 - 1.0 g/dL  0.7  Beta Globulin 0.7 - 1.3 g/dL  0.8  Gamma Globulin 0.4 - 1.8 g/dL  0.8  M-SPIKE, % Not Observed g/dL  Not Observed  (H): Data is abnormally high (L): Data is abnormally low (C): Corrected  ASSESSMENT & PLAN:  An 89 y.o. male who I recently seeing for  leukopenia.  He also had a mild degree of anemia.  However, when evaluating his recent labs, he has no evidence of nutritional deficiencies being present.  His serum protein electrophoresis also did not reveal a monoclonal spike, which makes it highly unlikely an underlying plasma cell dyscrasia is present.  His liver enzymes are also normal, which makes it very unlikely underlying hepatosplenic disease is factoring into his leukopenia.  When evaluating his CBC today, I am actually pleased as his white cells and hemoglobin are both improved.  Clinically, the patient appears to be doing fine.  As his peripheral counts are better, he will be followed conservatively for now.  I will see him back in 6 months to reassess his peripheral counts.  The patient understands all the plans discussed today and is in agreement with them.   Jason Vangorder Macarthur Critchley, MD

## 2022-03-11 ENCOUNTER — Other Ambulatory Visit: Payer: Self-pay | Admitting: Oncology

## 2022-03-11 ENCOUNTER — Inpatient Hospital Stay: Payer: Medicare Other

## 2022-03-11 ENCOUNTER — Inpatient Hospital Stay: Payer: Medicare Other | Attending: Oncology | Admitting: Oncology

## 2022-03-11 ENCOUNTER — Telehealth: Payer: Self-pay | Admitting: Oncology

## 2022-03-11 VITALS — BP 108/61 | HR 58 | Temp 97.8°F | Resp 18 | Ht 72.0 in | Wt 155.1 lb

## 2022-03-11 DIAGNOSIS — Z79899 Other long term (current) drug therapy: Secondary | ICD-10-CM | POA: Diagnosis not present

## 2022-03-11 DIAGNOSIS — D72819 Decreased white blood cell count, unspecified: Secondary | ICD-10-CM

## 2022-03-11 DIAGNOSIS — D649 Anemia, unspecified: Secondary | ICD-10-CM | POA: Insufficient documentation

## 2022-03-11 LAB — CBC AND DIFFERENTIAL
HCT: 33 — AB (ref 41–53)
Hemoglobin: 11.3 — AB (ref 13.5–17.5)
Neutrophils Absolute: 0.64
Platelets: 117 10*3/uL — AB (ref 150–400)
WBC: 2.2

## 2022-03-11 LAB — TSH: TSH: 1.766 u[IU]/mL (ref 0.350–4.500)

## 2022-03-11 LAB — CBC: RBC: 3.42 — AB (ref 3.87–5.11)

## 2022-03-11 NOTE — Telephone Encounter (Signed)
03/11/22 Next appt scheduled and confirmed wirh patient

## 2022-03-23 DIAGNOSIS — L821 Other seborrheic keratosis: Secondary | ICD-10-CM | POA: Diagnosis not present

## 2022-03-23 DIAGNOSIS — L578 Other skin changes due to chronic exposure to nonionizing radiation: Secondary | ICD-10-CM | POA: Diagnosis not present

## 2022-04-08 ENCOUNTER — Encounter (INDEPENDENT_AMBULATORY_CARE_PROVIDER_SITE_OTHER): Payer: Medicare Other | Admitting: Ophthalmology

## 2022-04-08 DIAGNOSIS — H43813 Vitreous degeneration, bilateral: Secondary | ICD-10-CM | POA: Diagnosis not present

## 2022-04-08 DIAGNOSIS — H34812 Central retinal vein occlusion, left eye, with macular edema: Secondary | ICD-10-CM | POA: Diagnosis not present

## 2022-04-08 DIAGNOSIS — H353132 Nonexudative age-related macular degeneration, bilateral, intermediate dry stage: Secondary | ICD-10-CM | POA: Diagnosis not present

## 2022-05-24 DIAGNOSIS — Z79899 Other long term (current) drug therapy: Secondary | ICD-10-CM | POA: Diagnosis not present

## 2022-05-24 DIAGNOSIS — D696 Thrombocytopenia, unspecified: Secondary | ICD-10-CM | POA: Diagnosis not present

## 2022-05-24 DIAGNOSIS — K219 Gastro-esophageal reflux disease without esophagitis: Secondary | ICD-10-CM | POA: Diagnosis not present

## 2022-05-24 DIAGNOSIS — M1990 Unspecified osteoarthritis, unspecified site: Secondary | ICD-10-CM | POA: Diagnosis not present

## 2022-05-24 DIAGNOSIS — D62 Acute posthemorrhagic anemia: Secondary | ICD-10-CM | POA: Diagnosis not present

## 2022-05-24 DIAGNOSIS — I482 Chronic atrial fibrillation, unspecified: Secondary | ICD-10-CM | POA: Diagnosis not present

## 2022-05-24 DIAGNOSIS — E861 Hypovolemia: Secondary | ICD-10-CM | POA: Diagnosis not present

## 2022-05-24 DIAGNOSIS — E871 Hypo-osmolality and hyponatremia: Secondary | ICD-10-CM | POA: Diagnosis not present

## 2022-05-24 DIAGNOSIS — K7689 Other specified diseases of liver: Secondary | ICD-10-CM | POA: Diagnosis not present

## 2022-05-24 DIAGNOSIS — Z7901 Long term (current) use of anticoagulants: Secondary | ICD-10-CM | POA: Diagnosis not present

## 2022-05-24 DIAGNOSIS — K5730 Diverticulosis of large intestine without perforation or abscess without bleeding: Secondary | ICD-10-CM | POA: Diagnosis not present

## 2022-05-24 DIAGNOSIS — I4891 Unspecified atrial fibrillation: Secondary | ICD-10-CM | POA: Diagnosis not present

## 2022-05-24 DIAGNOSIS — K631 Perforation of intestine (nontraumatic): Secondary | ICD-10-CM | POA: Diagnosis not present

## 2022-05-24 DIAGNOSIS — K5700 Diverticulitis of small intestine with perforation and abscess without bleeding: Secondary | ICD-10-CM | POA: Diagnosis not present

## 2022-05-24 DIAGNOSIS — N281 Cyst of kidney, acquired: Secondary | ICD-10-CM | POA: Diagnosis not present

## 2022-05-24 DIAGNOSIS — Z87891 Personal history of nicotine dependence: Secondary | ICD-10-CM | POA: Diagnosis not present

## 2022-05-24 DIAGNOSIS — R1032 Left lower quadrant pain: Secondary | ICD-10-CM | POA: Diagnosis not present

## 2022-05-24 DIAGNOSIS — K651 Peritoneal abscess: Secondary | ICD-10-CM | POA: Diagnosis not present

## 2022-05-24 DIAGNOSIS — Z792 Long term (current) use of antibiotics: Secondary | ICD-10-CM | POA: Diagnosis not present

## 2022-05-26 DIAGNOSIS — I4891 Unspecified atrial fibrillation: Secondary | ICD-10-CM | POA: Diagnosis not present

## 2022-05-26 DIAGNOSIS — K5700 Diverticulitis of small intestine with perforation and abscess without bleeding: Secondary | ICD-10-CM | POA: Diagnosis not present

## 2022-06-07 DIAGNOSIS — K631 Perforation of intestine (nontraumatic): Secondary | ICD-10-CM | POA: Diagnosis not present

## 2022-06-07 DIAGNOSIS — Z09 Encounter for follow-up examination after completed treatment for conditions other than malignant neoplasm: Secondary | ICD-10-CM | POA: Diagnosis not present

## 2022-06-08 DIAGNOSIS — R7301 Impaired fasting glucose: Secondary | ICD-10-CM | POA: Diagnosis not present

## 2022-06-08 DIAGNOSIS — K651 Peritoneal abscess: Secondary | ICD-10-CM | POA: Diagnosis not present

## 2022-06-08 DIAGNOSIS — D62 Acute posthemorrhagic anemia: Secondary | ICD-10-CM | POA: Diagnosis not present

## 2022-06-08 DIAGNOSIS — I480 Paroxysmal atrial fibrillation: Secondary | ICD-10-CM | POA: Diagnosis not present

## 2022-06-08 DIAGNOSIS — E785 Hyperlipidemia, unspecified: Secondary | ICD-10-CM | POA: Diagnosis not present

## 2022-06-09 ENCOUNTER — Other Ambulatory Visit: Payer: Medicare Other

## 2022-06-09 ENCOUNTER — Ambulatory Visit: Payer: Medicare Other | Admitting: Oncology

## 2022-06-09 DIAGNOSIS — R7301 Impaired fasting glucose: Secondary | ICD-10-CM | POA: Diagnosis not present

## 2022-06-09 DIAGNOSIS — D62 Acute posthemorrhagic anemia: Secondary | ICD-10-CM | POA: Diagnosis not present

## 2022-06-09 DIAGNOSIS — E785 Hyperlipidemia, unspecified: Secondary | ICD-10-CM | POA: Diagnosis not present

## 2022-06-10 LAB — LAB REPORT - SCANNED
A1c: 5.8
EGFR: 58

## 2022-06-14 NOTE — Progress Notes (Unsigned)
[facility]  [address] [place],  [place]  [address] [phone]  Clinic Day:  03/11/2022  Referring physician: Paulina Fusi, MD  HISTORY OF PRESENT ILLNESS:  The patient is an 89 y.o. male with both mild leukopenia and anemia.  He comes in today to reassess his peripheral counts.  Since his last visit, the patient had a partial bowel resection due to a bowel perforation.  He recalls having black stools approximately 3-4 weeks before his bowel perforation was found.  He denies having further issues with GI blood loss.  With respect to his leukopenia, he continues to deny having any spontaneous infections related to his low white count.  He also denies having increased fatigue or any overt forms of blood loss which concern him for progressive anemia.  PHYSICAL EXAM:  Blood pressure (!) 146/64, pulse 67, temperature 97.7 F (36.5 C), resp. rate 16, height 6' (1.829 m), weight 147 lb 12.8 oz (67 kg), SpO2 99 %. Wt Readings from Last 3 Encounters:  06/15/22 147 lb 12.8 oz (67 kg)  03/11/22 155 lb 1.6 oz (70.4 kg)  12/21/21 155 lb (70.3 kg)   Body mass index is 20.05 kg/m. Performance status (ECOG): 1 - Symptomatic but completely ambulatory Physical Exam Constitutional:      Appearance: Normal appearance. He is not ill-appearing.  HENT:     Mouth/Throat:     Mouth: Mucous membranes are moist.     Pharynx: Oropharynx is clear. No oropharyngeal exudate or posterior oropharyngeal erythema.  Cardiovascular:     Rate and Rhythm: Normal rate and regular rhythm.     Heart sounds: No murmur heard.    No friction rub. No gallop.  Pulmonary:     Effort: Pulmonary effort is normal. No respiratory distress.     Breath sounds: Normal breath sounds. No wheezing, rhonchi or rales.  Abdominal:     General: Bowel sounds are normal. There is no distension.     Palpations: Abdomen is soft. There is no mass.     Tenderness: There is no abdominal tenderness.   Musculoskeletal:        General: No swelling.     Right lower leg: No edema.     Left lower leg: No edema.  Lymphadenopathy:     Cervical: No cervical adenopathy.     Upper Body:     Right upper body: No supraclavicular or axillary adenopathy.     Left upper body: No supraclavicular or axillary adenopathy.     Lower Body: No right inguinal adenopathy. No left inguinal adenopathy.  Skin:    General: Skin is warm.     Coloration: Skin is not jaundiced.     Findings: No lesion or rash.  Neurological:     General: No focal deficit present.     Mental Status: He is alert and oriented to person, place, and time. Mental status is at baseline.  Psychiatric:        Mood and Affect: Mood normal.        Behavior: Behavior normal.        Thought Content: Thought content normal.    LABS:       Latest Ref Rng & Units 07/30/2021   12:00 AM 05/04/2021    6:20 PM 05/04/2021    5:47 PM  CMP  Glucose 70 - 99 mg/dL  914  782   BUN 4 - 21 17     17  16    Creatinine  0.6 - 1.3 1.2     1.20  1.17   Sodium 137 - 147 136     137  135   Potassium 3.5 - 5.1 mEq/L 4.1     4.4  4.3   Chloride 99 - 108 104     101  102   CO2 13 - 22 26      26    Calcium 8.7 - 10.7 8.7      9.2   Total Protein 6.5 - 8.1 g/dL   6.9   Total Bilirubin 0.3 - 1.2 mg/dL   1.0   Alkaline Phos 25 - 125 90      80   AST 14 - 40 24      22   ALT 10 - 40 U/L 15      13      This result is from an external source.    Latest Reference Range & Units 06/15/22 12:45  Iron 45 - 182 ug/dL 94  UIBC ug/dL 213  TIBC 086 - 578 ug/dL 469  Saturation Ratios 17.9 - 39.5 % 31  Ferritin 24 - 336 ng/mL 120  Folate >5.9 ng/mL 8.6  Vitamin B12 180 - 914 pg/mL 182   ASSESSMENT & PLAN:  An 89 y.o. male with leukopenia and mild anemia.  Once again, his labs today show mild pancytopenia.  However, his labs are not much different than what they have been recently.  There remain no signs of a nutritional deficiency being present.  As things  stand currently, none of his peripheral counts is dangerously low to where immediate intervention is necessary.  However, if his peripheral counts abruptly fall, a bone marrow biopsy would be necessary to evaluate the etiology behind his pancytopenia.  Clinically, he appears to be doing much better since his bowel surgery.  I will see him back in 4 months to reassess his peripheral counts.  The patient understands all the plans discussed today and is in agreement with them.    Jason Mcconnell Kirby Funk, MD

## 2022-06-15 ENCOUNTER — Inpatient Hospital Stay: Payer: Medicare Other | Attending: Oncology

## 2022-06-15 ENCOUNTER — Telehealth: Payer: Self-pay

## 2022-06-15 ENCOUNTER — Inpatient Hospital Stay: Payer: Medicare Other | Admitting: Oncology

## 2022-06-15 ENCOUNTER — Other Ambulatory Visit: Payer: Self-pay | Admitting: Oncology

## 2022-06-15 VITALS — BP 146/64 | HR 67 | Temp 97.7°F | Resp 16 | Ht 72.0 in | Wt 147.8 lb

## 2022-06-15 DIAGNOSIS — D649 Anemia, unspecified: Secondary | ICD-10-CM | POA: Diagnosis not present

## 2022-06-15 DIAGNOSIS — D72819 Decreased white blood cell count, unspecified: Secondary | ICD-10-CM

## 2022-06-15 DIAGNOSIS — D709 Neutropenia, unspecified: Secondary | ICD-10-CM | POA: Diagnosis not present

## 2022-06-15 DIAGNOSIS — D61818 Other pancytopenia: Secondary | ICD-10-CM | POA: Diagnosis not present

## 2022-06-15 LAB — CBC AND DIFFERENTIAL
HCT: 30 — AB (ref 41–53)
Hemoglobin: 10.2 — AB (ref 13.5–17.5)
Neutrophils Absolute: 1.06
Platelets: 119 10*3/uL — AB (ref 150–400)
WBC: 2.2

## 2022-06-15 LAB — FERRITIN: Ferritin: 120 ng/mL (ref 24–336)

## 2022-06-15 LAB — FOLATE: Folate: 8.6 ng/mL (ref >5.9)

## 2022-06-15 LAB — IRON AND TIBC
Iron: 94 ug/dL (ref 45–182)
Saturation Ratios: 31 % (ref 17.9–39.5)
TIBC: 302 ug/dL (ref 250–450)
UIBC: 208 ug/dL

## 2022-06-15 LAB — VITAMIN B12: Vitamin B-12: 182 pg/mL (ref 180–914)

## 2022-06-15 LAB — CBC: RBC: 3.14 — AB (ref 3.87–5.11)

## 2022-06-15 LAB — TSH: TSH: 2.162 u[IU]/mL (ref 0.350–4.500)

## 2022-06-15 NOTE — Telephone Encounter (Signed)
Dr Melvyn Neth reviewed labs and reports they are normal. Pt is iron deficient.  Pt notified and verbalized understanding.  Latest Reference Range & Units 06/15/22 12:45  Iron 45 - 182 ug/dL 94  UIBC ug/dL 161  TIBC 096 - 045 ug/dL 409  Saturation Ratios 17.9 - 39.5 % 31  Ferritin 24 - 336 ng/mL 120  Folate >5.9 ng/mL 8.6  Vitamin B12 180 - 914 pg/mL 182

## 2022-06-17 ENCOUNTER — Telehealth: Payer: Self-pay

## 2022-06-17 ENCOUNTER — Telehealth: Payer: Self-pay | Admitting: Cardiology

## 2022-06-17 MED ORDER — APIXABAN 5 MG PO TABS: 5.0000 mg | ORAL_TABLET | Freq: Two times a day (BID) | ORAL | 3 refills | Status: DC

## 2022-06-17 NOTE — Telephone Encounter (Signed)
Per Dr. Dulce Sellar pt can switch back to Eliquis 5mg  Bid. Xarelto discontinued.

## 2022-06-17 NOTE — Telephone Encounter (Signed)
[name]:  1. Name of Medication: rivaroxaban (XARELTO) 20 MG TABS tablet   2. How are you currently taking this medication (dosage and times per day)?   3. Are you having a reaction (difficulty breathing--STAT)?   4. What is your medication issue? Pt states that he was initially on eliquis, was taken off of it and was switched to xarelto after he thought it was hurting his stomach. He states he has been in the hospital and has had 10 inches of his small intestines taken out due to a bowel perforation. He thinks now, that the hurting in his stomach was coming from this, and not the eliquis. He would like to be put back on eliquis as the xarelto makes him dizzy. Please advise.

## 2022-06-21 ENCOUNTER — Telehealth: Payer: Self-pay | Admitting: Oncology

## 2022-06-21 NOTE — Telephone Encounter (Signed)
06/21/22 left message to confirm next appt

## 2022-07-07 DIAGNOSIS — R42 Dizziness and giddiness: Secondary | ICD-10-CM | POA: Diagnosis not present

## 2022-07-07 DIAGNOSIS — I480 Paroxysmal atrial fibrillation: Secondary | ICD-10-CM | POA: Diagnosis not present

## 2022-08-11 ENCOUNTER — Encounter (INDEPENDENT_AMBULATORY_CARE_PROVIDER_SITE_OTHER): Payer: Medicare Other | Admitting: Ophthalmology

## 2022-08-11 DIAGNOSIS — H353132 Nonexudative age-related macular degeneration, bilateral, intermediate dry stage: Secondary | ICD-10-CM | POA: Diagnosis not present

## 2022-08-11 DIAGNOSIS — H43813 Vitreous degeneration, bilateral: Secondary | ICD-10-CM

## 2022-08-11 DIAGNOSIS — H34812 Central retinal vein occlusion, left eye, with macular edema: Secondary | ICD-10-CM

## 2022-09-16 NOTE — Progress Notes (Signed)
Cardiology Office Note:    Date:  09/17/2022   ID:  Jason Mcconnell, DOB July 13, 1930, MRN [id number]  PCP:  Paulina Fusi, MD  Cardiologist:  Norman Herrlich, MD    Referring MD: Paulina Fusi, MD    ASSESSMENT:    1. SVT (supraventricular tachycardia)   2. Paroxysmal atrial fibrillation (HCC)   3. Chronic anticoagulation   4. Coronary artery calcification seen on CT scan    PLAN:    In order of problems listed above:  He is doing very well with his atrial arrhythmia SVT and atrial fibrillation self monitors with an Apple Watch 1 episode of atrial fibrillation with rate control brief self-limited If frequent occurrences would benefit from an antiarrhythmic drug He will continue his current anticoagulant With coronary calcification will start a low-dose of the low intensity statin.  He sees Dr. Fabio Asa in the next 2 months he will need a lipid profile if he does not I will have him come to my office   Next appointment: 6 months   Medication Adjustments/Labs and Tests Ordered: Current medicines are reviewed at length with the patient today.  Concerns regarding medicines are outlined above.  Orders Placed This Encounter  Procedures   EKG 12-Lead   No orders of the defined types were placed in this encounter.    History of Present Illness:    Jason Mcconnell is a 89 y.o. male with a hx of paroxysmal atrial fibrillation with chronic anticoagulation peripheral arterial disease and coronary artery calcification on CT scan last seen well 04/2021.  Event monitor reported 01/08/2022 showing sinus rhythm there are no pauses or high degree heart block or symptomatic events all sinus rhythm and associated PVCs and less frequently APCs supraventricular and ventricular ectopy rare and there are 105 episodes of SVT longest 13-1/2 seconds with a rate of 100 bpm.  He was also seen by hematology for mild pancytopenia.  Compliance with diet, lifestyle and  medications: Yes  As I requested he uses his son's smart watch she had an episode about midnight recently where his heart was racing he confirmed it was atrial fibrillation took Cardizem went back to sleep and when he woke up in the morning the episode was over He is very pleased he did not need to go to the emergency room and is comfortable with self monitoring I explained if he has frequent episodes he benefit from an antiarrhythmic drug He has no other cardiovascular symptoms edema shortness of breath chest pain or syncope We reviewed over-the-counter proarrhythmic medications he takes none I biased him despite what looks like a favorable lipid profile with coronary calcification to start a low-dose of a low intensity statin and he agrees Follow-up lipid profile 2 months Past Medical History:  Diagnosis Date   Arthritis    Atrial fibrillation (HCC)    a. Dx 08/02/18 at [facility]   Back pain    Coronary artery calcification seen on CT scan    EPIDIDYMAL CYST 12/19/2006   Qualifier: Diagnosis of  By: Debby Bud MD, Rosalyn Gess    Fracture of distal end of radius 11/12/2017   GERD (gastroesophageal reflux disease)    Ingrown nail 07/24/2015   Other malaise and fatigue 09/13/2007   Qualifier: Diagnosis of  By: Debby Bud MD, Rosalyn Gess    Pain in soft tissues of limb 07/24/2015   Wrist pain 08/21/2018    Current Medications: Current Meds  Medication Sig   apixaban (ELIQUIS) 5 MG TABS tablet Take  1 tablet (5 mg total) by mouth 2 (two) times daily.   diltiazem (CARDIZEM) 30 MG tablet Take 30 mg by mouth 2 (two) times daily.      EKGs/Labs/Other Studies Reviewed:    The following studies were reviewed today:    EKG Interpretation Date/Time:  Friday September 17 2022 08:17:39 EDT Ventricular Rate:  74 PR Interval:  198 QRS Duration:  92 QT Interval:  392 QTC Calculation: 435 R Axis:   65  Text Interpretation: Sinus rhythm with Premature atrial complexes RSR prime pattern V1 V2 When  compared with ECG of 04-May-2021 17:06, Premature atrial complexes are now Present Confirmed by Norman Herrlich ([id number]) on 09/17/2022 8:20:01 AM    Recent Labs: 06/15/2022: Hemoglobin 10.2; Platelets 119; TSH 2.162  Recent Lipid Panel    Component Value Date/Time   CHOL 133 05/12/2021 1344   TRIG 71.0 05/12/2021 1344   HDL 40.30 05/12/2021 1344   CHOLHDL 3 05/12/2021 1344   VLDL 14.2 05/12/2021 1344   LDLCALC 79 05/12/2021 1344    Physical Exam:    VS:  BP 122/60 (BP Location: Right Arm, Patient Position: Sitting, Cuff Size: Normal)   Pulse 74   Ht 6' (1.829 m)   Wt 151 lb 9.6 oz (68.8 kg)   SpO2 98%   BMI 20.56 kg/m     Wt Readings from Last 3 Encounters:  09/17/22 151 lb 9.6 oz (68.8 kg)  06/15/22 147 lb 12.8 oz (67 kg)  03/11/22 155 lb 1.6 oz (70.4 kg)     GEN:  Well nourished, well developed in no acute distress HEENT: Normal NECK: No JVD; No carotid bruits LYMPHATICS: No lymphadenopathy CARDIAC: RRR, no murmurs, rubs, gallops RESPIRATORY:  Clear to auscultation without rales, wheezing or rhonchi  ABDOMEN: Soft, non-tender, non-distended MUSCULOSKELETAL:  No edema; No deformity  SKIN: Warm and dry NEUROLOGIC:  Alert and oriented x 3 PSYCHIATRIC:  Normal affect    Signed, Norman Herrlich, MD  09/17/2022 8:34 AM    [facility]

## 2022-09-17 ENCOUNTER — Ambulatory Visit: Payer: Medicare Other | Attending: Cardiology | Admitting: Cardiology

## 2022-09-17 ENCOUNTER — Encounter: Payer: Self-pay | Admitting: Cardiology

## 2022-09-17 VITALS — BP 122/60 | HR 74 | Ht 72.0 in | Wt 151.6 lb

## 2022-09-17 DIAGNOSIS — I2510 Atherosclerotic heart disease of native coronary artery without angina pectoris: Secondary | ICD-10-CM | POA: Diagnosis not present

## 2022-09-17 DIAGNOSIS — I480 Paroxysmal atrial fibrillation: Secondary | ICD-10-CM

## 2022-09-17 DIAGNOSIS — Z7901 Long term (current) use of anticoagulants: Secondary | ICD-10-CM | POA: Diagnosis not present

## 2022-09-17 DIAGNOSIS — I471 Supraventricular tachycardia, unspecified: Secondary | ICD-10-CM | POA: Diagnosis not present

## 2022-09-17 MED ORDER — DILTIAZEM HCL 30 MG PO TABS: 30.0000 mg | ORAL_TABLET | Freq: Two times a day (BID) | ORAL | 3 refills | Status: DC

## 2022-09-17 MED ORDER — PRAVASTATIN SODIUM 20 MG PO TABS: 20.0000 mg | ORAL_TABLET | Freq: Every evening | ORAL | 3 refills | Status: DC

## 2022-09-17 NOTE — Patient Instructions (Addendum)
Medication Instructions:  Your physician has recommended you make the following change in your medication:   START: Pravastatin 20 mg daily  *If you need a refill on your cardiac medications before your next appointment, please call your pharmacy*   Lab Work: Your physician recommends that you return for lab work in:   Labs in 2 month: Lipid  If you have labs (blood work) drawn today and your tests are completely normal, you will receive your results only by: MyChart Message (if you have MyChart) OR A paper copy in the mail If you have any lab test that is abnormal or we need to change your treatment, we will call you to review the results.   Testing/Procedures: None   Follow-Up: At [facility], you and your health needs are our priority.  As part of our continuing mission to provide you with exceptional heart care, we have created designated Provider Care Teams.  These Care Teams include your primary Cardiologist (physician) and Advanced Practice Providers (APPs -  Physician Assistants and Nurse Practitioners) who all work together to provide you with the care you need, when you need it.  We recommend signing up for the patient portal called "MyChart".  Sign up information is provided on this After Visit Summary.  MyChart is used to connect with patients for Virtual Visits (Telemedicine).  Patients are able to view lab/test results, encounter notes, upcoming appointments, etc.  Non-urgent messages can be sent to your provider as well.   To learn more about what you can do with MyChart, go to [email or web address].    Your next appointment:   6 month(s)  Provider:   Norman Herrlich, MD    Other Instructions None   1. Avoid all over-the-counter antihistamines except Claritin/Loratadine and Zyrtec/Cetrizine. 2. Avoid all combination including cold sinus allergies flu decongestant and sleep medications 3. You can use Robitussin DM Mucinex and Mucinex DM for cough. 4.  can use Tylenol aspirin ibuprofen and naproxen but no combinations such as sleep or sinus.

## 2022-09-22 DIAGNOSIS — L570 Actinic keratosis: Secondary | ICD-10-CM | POA: Diagnosis not present

## 2022-09-22 DIAGNOSIS — L820 Inflamed seborrheic keratosis: Secondary | ICD-10-CM | POA: Diagnosis not present

## 2022-10-01 DIAGNOSIS — I2510 Atherosclerotic heart disease of native coronary artery without angina pectoris: Secondary | ICD-10-CM | POA: Diagnosis not present

## 2022-10-02 LAB — LIPID PANEL
Chol/HDL Ratio: 2.6 ratio (ref 0.0–5.0)
Cholesterol, Total: 113 mg/dL (ref 100–199)
HDL: 44 mg/dL (ref >39)
LDL Chol Calc (NIH): 57 mg/dL (ref 0–99)
Triglycerides: 54 mg/dL (ref 0–149)
VLDL Cholesterol Cal: 12 mg/dL (ref 5–40)

## 2022-10-04 ENCOUNTER — Telehealth: Payer: Self-pay | Admitting: Cardiology

## 2022-10-04 NOTE — Telephone Encounter (Signed)
[name]:  1. Name of Medication:  pravastatin (PRAVACHOL) 20 MG tablet  2. How are you currently taking this medication (dosage and times per day)?  As prescribed  3. Are you having a reaction (difficulty breathing--STAT)?   4. What is your medication issue?   Patient states he saw that his cholesterol was 113 on Mychart. Patient states he assumes that number pretty good and he may not need to take medication any longer. Patient also states he doesn't feel good when he takes the medication. Patient was unable to describe any symptoms - he states he just doesn't feel good.

## 2022-10-05 NOTE — Telephone Encounter (Signed)
Called patient and he reported that all of the measures on his lipid panel were within normal limits and he did not know why he was taking the pravastatin medication. Looking back on Dr. Hulen Shouts last office visit note he stated that the patient had coronary calcification. I informed him of this and explained to the patient that was the reason he had started him on the pravastatin medication. Patient stated that it still made him feel bad and wanted to see if there was another medication that he could take instead of the pravastatin? I explained that I would forward this to Dr. Dulce Sellar for further guidance.

## 2022-10-12 DIAGNOSIS — Z9181 History of falling: Secondary | ICD-10-CM | POA: Diagnosis not present

## 2022-10-12 DIAGNOSIS — Z0000 Encounter for general adult medical examination without abnormal findings: Secondary | ICD-10-CM | POA: Diagnosis not present

## 2022-10-14 NOTE — Progress Notes (Signed)
[facility]  [address] [place],  [place]  [address] [phone]  Clinic Day:  10/15/2022  Referring physician: Paulina Fusi, MD  HISTORY OF PRESENT ILLNESS:  The patient is an 89 y.o. male with both mild leukopenia and anemia.  Previous labs have shown mild pancytopenia.  He comes in today to reassess his peripheral counts.  Since his last visit, the patient has been doing well.  He is back to walking 2-3 miles daily.  The patient believes he has completely recovered from previous bowel resection, which was done to address a bowel perforation that led to black stools and secondary anemia.  He denies having any overt forms of blood loss since his last visit.  With respect to his leukopenia, he continues to deny having any spontaneous infections related to his low white count.    PHYSICAL EXAM:  Blood pressure 135/65, pulse 67, temperature 98.1 F (36.7 C), resp. rate 16, height 6' (1.829 m), weight 149 lb 8 oz (67.8 kg), SpO2 98%. Wt Readings from Last 3 Encounters:  10/15/22 149 lb 8 oz (67.8 kg)  09/17/22 151 lb 9.6 oz (68.8 kg)  06/15/22 147 lb 12.8 oz (67 kg)   Body mass index is 20.28 kg/m. Performance status (ECOG): 1 - Symptomatic but completely ambulatory Physical Exam Constitutional:      Appearance: Normal appearance. He is not ill-appearing.  HENT:     Mouth/Throat:     Mouth: Mucous membranes are moist.     Pharynx: Oropharynx is clear. No oropharyngeal exudate or posterior oropharyngeal erythema.  Cardiovascular:     Rate and Rhythm: Normal rate and regular rhythm.     Heart sounds: No murmur heard.    No friction rub. No gallop.  Pulmonary:     Effort: Pulmonary effort is normal. No respiratory distress.     Breath sounds: Normal breath sounds. No wheezing, rhonchi or rales.  Abdominal:     General: Bowel sounds are normal. There is no distension.     Palpations: Abdomen is soft. There is no mass.     Tenderness: There  is no abdominal tenderness.  Musculoskeletal:        General: No swelling.     Right lower leg: No edema.     Left lower leg: No edema.  Lymphadenopathy:     Cervical: No cervical adenopathy.     Upper Body:     Right upper body: No supraclavicular or axillary adenopathy.     Left upper body: No supraclavicular or axillary adenopathy.     Lower Body: No right inguinal adenopathy. No left inguinal adenopathy.  Skin:    General: Skin is warm.     Coloration: Skin is not jaundiced.     Findings: No lesion or rash.  Neurological:     General: No focal deficit present.     Mental Status: He is alert and oriented to person, place, and time. Mental status is at baseline.  Psychiatric:        Mood and Affect: Mood normal.        Behavior: Behavior normal.        Thought Content: Thought content normal.    LABS:       Latest Ref Rng & Units 07/30/2021   12:00 AM 05/04/2021    6:20 PM 05/04/2021    5:47 PM  CMP  Glucose 70 - 99 mg/dL  147  829   BUN 4 - 21 17  17  16   Creatinine 0.6 - 1.3 1.2     1.20  1.17   Sodium 137 - 147 136     137  135   Potassium 3.5 - 5.1 mEq/L 4.1     4.4  4.3   Chloride 99 - 108 104     101  102   CO2 13 - 22 26      26    Calcium 8.7 - 10.7 8.7      9.2   Total Protein 6.5 - 8.1 g/dL   6.9   Total Bilirubin 0.3 - 1.2 mg/dL   1.0   Alkaline Phos 25 - 125 90      80   AST 14 - 40 24      22   ALT 10 - 40 U/L 15      13      This result is from an external source.   ASSESSMENT & PLAN:  An 89 y.o. male with pancytopenia.  Once again, his labs today show mild pancytopenia.  However, his labs are not much different than what they have been recently.  Overall, my main concern is he may have myelodysplasia, particularly as prior labs have shown no nutritional deficiencies or other etiologies behind his low counts.  For now, his counts will be followed conservatively.  I will see him back in 4 months for repeat clinical assessment.  If his peripheral counts  abruptly fall, a bone marrow biopsy would be necessary.  The patient understands all the plans discussed today and is in agreement with them.    Emelly Wurtz Kirby Funk, MD

## 2022-10-15 ENCOUNTER — Other Ambulatory Visit: Payer: Self-pay

## 2022-10-15 ENCOUNTER — Other Ambulatory Visit: Payer: Self-pay | Admitting: Oncology

## 2022-10-15 ENCOUNTER — Inpatient Hospital Stay: Payer: Medicare Other

## 2022-10-15 ENCOUNTER — Inpatient Hospital Stay: Payer: Medicare Other | Attending: Oncology | Admitting: Oncology

## 2022-10-15 VITALS — BP 135/65 | HR 67 | Temp 98.1°F | Resp 16 | Ht 72.0 in | Wt 149.5 lb

## 2022-10-15 DIAGNOSIS — D649 Anemia, unspecified: Secondary | ICD-10-CM | POA: Diagnosis not present

## 2022-10-15 DIAGNOSIS — D72819 Decreased white blood cell count, unspecified: Secondary | ICD-10-CM

## 2022-10-15 DIAGNOSIS — D708 Other neutropenia: Secondary | ICD-10-CM

## 2022-10-15 LAB — CBC AND DIFFERENTIAL
HCT: 32 — AB (ref 41–53)
Hemoglobin: 10.8 — AB (ref 13.5–17.5)
Neutrophils Absolute: 0.99
Platelets: 96 10*3/uL — AB (ref 150–400)
WBC: 2.2

## 2022-10-15 LAB — CBC: RBC: 3.3 — AB (ref 3.87–5.11)

## 2022-10-15 MED ORDER — EZETIMIBE 10 MG PO TABS: 10.0000 mg | ORAL_TABLET | Freq: Every day | ORAL | 3 refills | Status: DC

## 2022-10-15 NOTE — Telephone Encounter (Signed)
Called patient and informed him of Dr. Hulen Shouts recommendation below:  "Sure, stop his statin wait a week start Zetia 10 mg once daily it is not a statin.  Same plan for follow-up labs with his family doctor"  Patient verbalized understanding and had no further questions at this time.

## 2022-10-21 DIAGNOSIS — D519 Vitamin B12 deficiency anemia, unspecified: Secondary | ICD-10-CM | POA: Diagnosis not present

## 2022-12-10 DIAGNOSIS — I480 Paroxysmal atrial fibrillation: Secondary | ICD-10-CM | POA: Diagnosis not present

## 2022-12-10 DIAGNOSIS — Z23 Encounter for immunization: Secondary | ICD-10-CM | POA: Diagnosis not present

## 2022-12-10 DIAGNOSIS — D72819 Decreased white blood cell count, unspecified: Secondary | ICD-10-CM | POA: Diagnosis not present

## 2022-12-10 DIAGNOSIS — R7301 Impaired fasting glucose: Secondary | ICD-10-CM | POA: Diagnosis not present

## 2022-12-10 DIAGNOSIS — E785 Hyperlipidemia, unspecified: Secondary | ICD-10-CM | POA: Diagnosis not present

## 2022-12-28 DIAGNOSIS — L820 Inflamed seborrheic keratosis: Secondary | ICD-10-CM | POA: Diagnosis not present

## 2022-12-28 DIAGNOSIS — L570 Actinic keratosis: Secondary | ICD-10-CM | POA: Diagnosis not present

## 2022-12-28 DIAGNOSIS — L300 Nummular dermatitis: Secondary | ICD-10-CM | POA: Diagnosis not present

## 2022-12-28 DIAGNOSIS — L578 Other skin changes due to chronic exposure to nonionizing radiation: Secondary | ICD-10-CM | POA: Diagnosis not present

## 2022-12-28 DIAGNOSIS — L821 Other seborrheic keratosis: Secondary | ICD-10-CM | POA: Diagnosis not present

## 2022-12-29 ENCOUNTER — Encounter (INDEPENDENT_AMBULATORY_CARE_PROVIDER_SITE_OTHER): Payer: Medicare Other | Admitting: Ophthalmology

## 2022-12-29 DIAGNOSIS — H34812 Central retinal vein occlusion, left eye, with macular edema: Secondary | ICD-10-CM

## 2022-12-29 DIAGNOSIS — H353132 Nonexudative age-related macular degeneration, bilateral, intermediate dry stage: Secondary | ICD-10-CM | POA: Diagnosis not present

## 2022-12-29 DIAGNOSIS — H43813 Vitreous degeneration, bilateral: Secondary | ICD-10-CM

## 2023-01-25 DIAGNOSIS — B351 Tinea unguium: Secondary | ICD-10-CM | POA: Diagnosis not present

## 2023-02-13 NOTE — Progress Notes (Unsigned)
[facility]  [address] [place],  [place]  [address] [phone]  Clinic Day:  10/15/2022  Referring physician: Paulina Fusi, MD  HISTORY OF PRESENT ILLNESS:  The patient is an 89 y.o. male with both mild leukopenia and anemia.  Previous labs have shown mild pancytopenia.  He comes in today to reassess his peripheral counts.  Since his last visit, the patient has been doing well.  He is back to walking 2-3 miles daily.  The patient believes he has completely recovered from previous bowel resection, which was done to address a bowel perforation that led to black stools and secondary anemia.  He denies having any overt forms of blood loss since his last visit.  With respect to his leukopenia, he continues to deny having any spontaneous infections related to his low white count.    PHYSICAL EXAM:  There were no vitals taken for this visit. Wt Readings from Last 3 Encounters:  10/15/22 149 lb 8 oz (67.8 kg)  09/17/22 151 lb 9.6 oz (68.8 kg)  06/15/22 147 lb 12.8 oz (67 kg)   There is no height or weight on file to calculate BMI. Performance status (ECOG): 1 - Symptomatic but completely ambulatory Physical Exam Constitutional:      Appearance: Normal appearance. He is not ill-appearing.  HENT:     Mouth/Throat:     Mouth: Mucous membranes are moist.     Pharynx: Oropharynx is clear. No oropharyngeal exudate or posterior oropharyngeal erythema.  Cardiovascular:     Rate and Rhythm: Normal rate and regular rhythm.     Heart sounds: No murmur heard.    No friction rub. No gallop.  Pulmonary:     Effort: Pulmonary effort is normal. No respiratory distress.     Breath sounds: Normal breath sounds. No wheezing, rhonchi or rales.  Abdominal:     General: Bowel sounds are normal. There is no distension.     Palpations: Abdomen is soft. There is no mass.     Tenderness: There is no abdominal tenderness.  Musculoskeletal:        General: No  swelling.     Right lower leg: No edema.     Left lower leg: No edema.  Lymphadenopathy:     Cervical: No cervical adenopathy.     Upper Body:     Right upper body: No supraclavicular or axillary adenopathy.     Left upper body: No supraclavicular or axillary adenopathy.     Lower Body: No right inguinal adenopathy. No left inguinal adenopathy.  Skin:    General: Skin is warm.     Coloration: Skin is not jaundiced.     Findings: No lesion or rash.  Neurological:     General: No focal deficit present.     Mental Status: He is alert and oriented to person, place, and time. Mental status is at baseline.  Psychiatric:        Mood and Affect: Mood normal.        Behavior: Behavior normal.        Thought Content: Thought content normal.    LABS:       Latest Ref Rng & Units 07/30/2021   12:00 AM 05/04/2021    6:20 PM 05/04/2021    5:47 PM  CMP  Glucose 70 - 99 mg/dL  865  784   BUN 4 - 21 17     17  16    Creatinine 0.6 - 1.3 1.2  1.20  1.17   Sodium 137 - 147 136     137  135   Potassium 3.5 - 5.1 mEq/L 4.1     4.4  4.3   Chloride 99 - 108 104     101  102   CO2 13 - 22 26      26    Calcium 8.7 - 10.7 8.7      9.2   Total Protein 6.5 - 8.1 g/dL   6.9   Total Bilirubin 0.3 - 1.2 mg/dL   1.0   Alkaline Phos 25 - 125 90      80   AST 14 - 40 24      22   ALT 10 - 40 U/L 15      13      This result is from an external source.   ASSESSMENT & PLAN:  An 89 y.o. male with pancytopenia.  Once again, his labs today show mild pancytopenia.  However, his labs are not much different than what they have been recently.  Overall, my main concern is he may have myelodysplasia, particularly as prior labs have shown no nutritional deficiencies or other etiologies behind his low counts.  For now, his counts will be followed conservatively.  I will see him back in 4 months for repeat clinical assessment.  If his peripheral counts abruptly fall, a bone marrow biopsy would be necessary.  The  patient understands all the plans discussed today and is in agreement with them.    Addiel Mccardle Kirby Funk, MD

## 2023-02-14 ENCOUNTER — Inpatient Hospital Stay: Payer: Medicare Other | Attending: Oncology | Admitting: Oncology

## 2023-02-14 ENCOUNTER — Other Ambulatory Visit: Payer: Self-pay | Admitting: Oncology

## 2023-02-14 ENCOUNTER — Telehealth: Payer: Self-pay | Admitting: Oncology

## 2023-02-14 ENCOUNTER — Other Ambulatory Visit: Payer: Self-pay

## 2023-02-14 ENCOUNTER — Inpatient Hospital Stay: Payer: Medicare Other

## 2023-02-14 VITALS — BP 131/68 | HR 74 | Temp 98.5°F | Resp 16 | Ht 72.0 in | Wt 151.1 lb

## 2023-02-14 DIAGNOSIS — D72819 Decreased white blood cell count, unspecified: Secondary | ICD-10-CM

## 2023-02-14 DIAGNOSIS — D708 Other neutropenia: Secondary | ICD-10-CM | POA: Diagnosis not present

## 2023-02-14 DIAGNOSIS — D61818 Other pancytopenia: Secondary | ICD-10-CM | POA: Diagnosis not present

## 2023-02-14 DIAGNOSIS — D696 Thrombocytopenia, unspecified: Secondary | ICD-10-CM | POA: Insufficient documentation

## 2023-02-14 DIAGNOSIS — D649 Anemia, unspecified: Secondary | ICD-10-CM | POA: Diagnosis not present

## 2023-02-14 LAB — IRON AND TIBC
Iron: 74 ug/dL (ref 45–182)
Saturation Ratios: 22 % (ref 17.9–39.5)
TIBC: 335 ug/dL (ref 250–450)
UIBC: 261 ug/dL

## 2023-02-14 LAB — CBC WITH DIFFERENTIAL (CANCER CENTER ONLY)
Abs Immature Granulocytes: 0.06 10*3/uL (ref 0.00–0.07)
Basophils Absolute: 0 10*3/uL (ref 0.0–0.1)
Basophils Relative: 0 %
Eosinophils Absolute: 0 10*3/uL (ref 0.0–0.5)
Eosinophils Relative: 0 %
HCT: 33.9 % — ABNORMAL LOW (ref 39.0–52.0)
Hemoglobin: 11.3 g/dL — ABNORMAL LOW (ref 13.0–17.0)
Immature Granulocytes: 2 %
Lymphocytes Relative: 32 %
Lymphs Abs: 0.9 10*3/uL (ref 0.7–4.0)
MCH: 32.3 pg (ref 26.0–34.0)
MCHC: 33.3 g/dL (ref 30.0–36.0)
MCV: 96.9 fL (ref 80.0–100.0)
Monocytes Absolute: 0.4 10*3/uL (ref 0.1–1.0)
Monocytes Relative: 14 %
Neutro Abs: 1.4 10*3/uL — ABNORMAL LOW (ref 1.7–7.7)
Neutrophils Relative %: 52 %
Platelet Count: 119 10*3/uL — ABNORMAL LOW (ref 150–400)
RBC: 3.5 MIL/uL — ABNORMAL LOW (ref 4.22–5.81)
RDW: 14.6 % (ref 11.5–15.5)
WBC Count: 2.8 10*3/uL — ABNORMAL LOW (ref 4.0–10.5)
WBC Morphology: ABNORMAL
nRBC: 0 % (ref 0.0–0.2)
nRBC: 0 /100{WBCs}

## 2023-02-14 LAB — FERRITIN: Ferritin: 70 ng/mL (ref 24–336)

## 2023-02-14 LAB — VITAMIN B12: Vitamin B-12: 210 pg/mL (ref 180–914)

## 2023-02-14 LAB — FOLATE: Folate: 13.7 ng/mL (ref >5.9)

## 2023-02-14 NOTE — Telephone Encounter (Signed)
Patient has been scheduled for follow-up visit per 02/14/23 LOS.  Pt given an appt calendar with date and time.

## 2023-02-14 NOTE — Progress Notes (Signed)
[facility]  [address] [place],  [place]  [address] [phone]  Clinic Day:  02/14/2023  Referring physician: Paulina Fusi, MD   HISTORY OF PRESENT ILLNESS:  The patient is a 89 y.o. male with both mild leukopenia and anemia.  Previous labs have shown mild pancytopenia.  However, as his counts have been stable, he has been followed conservatively.  He comes in today to reassess his peripheral counts.  Since his last visit, the patient has been doing well.  He claims to feel better since his last visit.  He denies having any overt forms of blood loss.  With respect to his leukopenia, he continues to deny having any spontaneous infections related to his low white count.  As a pertains to his thrombocytopenia, he denies having any subcutaneous bleeding/bruising issues.  PHYSICAL EXAM:  Blood pressure 131/68, pulse 74, temperature 98.5 F (36.9 C), temperature source Oral, resp. rate 16, height 6' (1.829 m), weight 151 lb 1.6 oz (68.5 kg), SpO2 99%. Wt Readings from Last 3 Encounters:  02/14/23 151 lb 1.6 oz (68.5 kg)  10/15/22 149 lb 8 oz (67.8 kg)  09/17/22 151 lb 9.6 oz (68.8 kg)   Body mass index is 20.49 kg/m. Performance status (ECOG): 1 - Symptomatic but completely ambulatory Physical Exam  LABS:      Latest Ref Rng & Units 02/14/2023    9:48 AM 10/15/2022   12:00 AM 06/15/2022   12:00 AM  CBC  WBC 4.0 - 10.5 K/uL 2.8  2.2     2.2      Hemoglobin 13.0 - 17.0 g/dL 91.4  78.2     95.6      Hematocrit 39.0 - 52.0 % 33.9  32     30      Platelets 150 - 400 K/uL 119  96     119         This result is from an external source.      Latest Ref Rng & Units 07/30/2021   12:00 AM 05/04/2021    6:20 PM 05/04/2021    5:47 PM  CMP  Glucose 70 - 99 mg/dL  213  086   BUN 4 - 21 17     17  16    Creatinine 0.6 - 1.3 1.2     1.20  1.17   Sodium 137 - 147 136     137  135   Potassium 3.5 - 5.1 mEq/L 4.1     4.4  4.3   Chloride 99 - 108 104      101  102   CO2 13 - 22 26      26    Calcium 8.7 - 10.7 8.7      9.2   Total Protein 6.5 - 8.1 g/dL   6.9   Total Bilirubin 0.3 - 1.2 mg/dL   1.0   Alkaline Phos 25 - 125 90      80   AST 14 - 40 24      22   ALT 10 - 40 U/L 15      13      This result is from an external source.    Latest Reference Range & Units 02/14/23 09:48  Iron 45 - 182 ug/dL 74  UIBC ug/dL 578  TIBC 469 - 629 ug/dL 528  Saturation Ratios 17.9 - 39.5 % 22  Ferritin 24 - 336 ng/mL 70  Folate >5.9 ng/mL 13.7  Vitamin B12 180 - 914 pg/mL 210   ASSESSMENT & PLAN:  Assessment/Plan:  A 89 y.o. male with pancytopenia.  However, when comparing his labs today to what they were previously, all 3 blood cell lines are better.  Furthermore, labs today show no evidence of any nutritional deficiency.  Clinically, this patient is doing very well.  As that is the case, he will continue to be followed conservatively.  I will see him back in 6 months to reassess his peripheral counts.  The patient understands all the plans discussed today and is in agreement with them.     Gryffin Altice Kirby Funk, MD

## 2023-03-03 DIAGNOSIS — K5909 Other constipation: Secondary | ICD-10-CM | POA: Diagnosis not present

## 2023-03-03 DIAGNOSIS — R1032 Left lower quadrant pain: Secondary | ICD-10-CM | POA: Diagnosis not present

## 2023-03-15 ENCOUNTER — Encounter: Payer: Self-pay | Admitting: Cardiology

## 2023-03-15 NOTE — Progress Notes (Unsigned)
 Cardiology Office Note:    Date:  03/16/2023   ID:  Jason Mcconnell, DOB [birth year], MRN [id number]  PCP:  Paulina Fusi, MD  Cardiologist:  Norman Herrlich, MD    Referring MD: Paulina Fusi, MD    ASSESSMENT:    1. Paroxysmal atrial fibrillation (HCC)   2. Chronic anticoagulation   3. SVT (supraventricular tachycardia) (HCC)   4. Coronary artery calcification seen on CT scan   5. PVC's (premature ventricular contractions)   6. Dyslipidemia    PLAN:    In order of problems listed above:  Aser continues to do well no recurrent episodes of atrial fibrillation he takes his calcium channel blocker once a day often at times twice a day. Continue to use his smart watch to self monitor Continue his current anticoagulant without bleeding complication He has had no symptomatic PVCs He has made a decision not to except lipid-lowering therapy   Next appointment: 6 months   Medication Adjustments/Labs and Tests Ordered: Current medicines are reviewed at length with the patient today.  Concerns regarding medicines are outlined above.  No orders of the defined types were placed in this encounter.  No orders of the defined types were placed in this encounter.    History of Present Illness:    Jason Mcconnell is a 89 y.o. male with a hx of  paroxysmal atrial fibrillation with chronic anticoagulation SVT coronary artery calcification on CT scan symptomatic PVCs last seen 09/17/2022.  Compliance with diet, lifestyle and medications: Yes  [name] remains very active he is out yesterday target shooting at the gun range. He is wearing a smart watch she has had no alerts for rapid heart rhythm he has had no clinical episodes of atrial fibrillation no bleeding complication of his anticoagulant and has had no cardiovascular symptoms of palpitations syncope edema shortness of breath or chest pain He felt badly when he took Zetia and has made a decision not to  except lipid-lowering therapy at this point in his life. Few weeks ago he had an episode of diverticulitis treated outpatient with oral antibiotics and is recovered Laboratory work 12/10/2022 was exemplary with cholesterol 128 LDL 73 A1c 6.13 creatinine 1.1 potassium 4.4 Past Medical History:  Diagnosis Date   Arthritis    Atrial fibrillation (HCC)    a. Dx 08/02/18 at [facility]   Back pain    Coronary artery calcification seen on CT scan    EPIDIDYMAL CYST 12/19/2006   Qualifier: Diagnosis of  By: Debby Bud MD, Rosalyn Gess    Fracture of distal end of radius 11/12/2017   GERD (gastroesophageal reflux disease)    Ingrown nail 07/24/2015   Leukopenia 09/06/2021   Other malaise and fatigue 09/13/2007   Qualifier: Diagnosis of  By: Debby Bud MD, Rosalyn Gess    Pain in soft tissues of limb 07/24/2015   Wrist pain 08/21/2018    Current Medications: Current Meds  Medication Sig   apixaban (ELIQUIS) 5 MG TABS tablet Take 1 tablet (5 mg total) by mouth 2 (two) times daily.   diltiazem (CARDIZEM) 30 MG tablet Take 1 tablet (30 mg total) by mouth 2 (two) times daily.      EKGs/Labs/Other Studies Reviewed:    The following studies were reviewed today:  Cardiac Studies & Procedures   ______________________________________________________________________________________________        Lisabeth Pick  LONG TERM MONITOR (3-14 DAYS) 01/08/2022  Narrative Patch Wear Time:  13 days and 21 hours (2023-12-04T09:13:47-0500 to 2023-12-18T06:36:28-0500)  Patient had  a min HR of 50 bpm, max HR of 194 bpm, and avg HR of 66 bpm. Predominant underlying rhythm was Sinus Rhythm.  There were no pauses of 3 seconds or greater and no episodes of second or third-degree AV nodal block or sinus node exit block.  There were 10 triggered and 10 diary events all sinus rhythm and often associated with PVCs and less frequently APCs.  1 run of Ventricular Tachycardia occurred lasting 5 beats with a max rate of  117 bpm (avg 105 bpm).  105 Supraventricular Tachycardia runs occurred, the run with the fastest interval lasting 4 beats with a max rate of 194 bpm, the longest lasting 13.4 secs with an avg rate of 100 bpm.  Isolated SVEs were rare (<1.0%), SVE Couplets were rare (<1.0%), and SVE Triplets were rare (<1.0%).  Isolated VEs were rare (<1.0%), VE Couplets were rare (<1.0%), and no VE Triplets were present.       ______________________________________________________________________________________________          Recent Labs: 06/15/2022: TSH 2.162 02/14/2023: Hemoglobin 11.3; Platelet Count 119  Recent Lipid Panel    Component Value Date/Time   CHOL 113 10/01/2022 0826   TRIG 54 10/01/2022 0826   HDL 44 10/01/2022 0826   CHOLHDL 2.6 10/01/2022 0826   CHOLHDL 3 05/12/2021 1344   VLDL 14.2 05/12/2021 1344   LDLCALC 57 10/01/2022 0826    Physical Exam:    VS:  BP 122/64   Pulse 67   Ht 6' (1.829 m)   Wt 155 lb 3.2 oz (70.4 kg)   SpO2 99%   BMI 21.05 kg/m     Wt Readings from Last 3 Encounters:  03/16/23 155 lb 3.2 oz (70.4 kg)  02/14/23 151 lb 1.6 oz (68.5 kg)  10/15/22 149 lb 8 oz (67.8 kg)     GEN: He looks younger than his age well nourished, well developed in no acute distress HEENT: Normal NECK: No JVD; No carotid bruits LYMPHATICS: No lymphadenopathy CARDIAC: RRR, no murmurs, rubs, gallops RESPIRATORY:  Clear to auscultation without rales, wheezing or rhonchi  ABDOMEN: Soft, non-tender, non-distended MUSCULOSKELETAL:  No edema; No deformity  SKIN: Warm and dry NEUROLOGIC:  Alert and oriented x 3 PSYCHIATRIC:  Normal affect    Signed, Norman Herrlich, MD  03/16/2023 9:24 AM     [facility]

## 2023-03-16 ENCOUNTER — Ambulatory Visit: Payer: Medicare Other | Attending: Cardiology | Admitting: Cardiology

## 2023-03-16 ENCOUNTER — Encounter: Payer: Self-pay | Admitting: Cardiology

## 2023-03-16 VITALS — BP 122/64 | HR 67 | Ht 72.0 in | Wt 155.2 lb

## 2023-03-16 DIAGNOSIS — I471 Supraventricular tachycardia, unspecified: Secondary | ICD-10-CM

## 2023-03-16 DIAGNOSIS — I480 Paroxysmal atrial fibrillation: Secondary | ICD-10-CM | POA: Diagnosis not present

## 2023-03-16 DIAGNOSIS — I493 Ventricular premature depolarization: Secondary | ICD-10-CM

## 2023-03-16 DIAGNOSIS — E785 Hyperlipidemia, unspecified: Secondary | ICD-10-CM | POA: Diagnosis not present

## 2023-03-16 DIAGNOSIS — I2510 Atherosclerotic heart disease of native coronary artery without angina pectoris: Secondary | ICD-10-CM

## 2023-03-16 DIAGNOSIS — Z7901 Long term (current) use of anticoagulants: Secondary | ICD-10-CM | POA: Diagnosis not present

## 2023-03-16 NOTE — Patient Instructions (Signed)
 Medication Instructions:  Your physician recommends that you continue on your current medications as directed. Please refer to the Current Medication list given to you today.  *If you need a refill on your cardiac medications before your next appointment, please call your pharmacy*   Lab Work: None If you have labs (blood work) drawn today and your tests are completely normal, you will receive your results only by: MyChart Message (if you have MyChart) OR A paper copy in the mail If you have any lab test that is abnormal or we need to change your treatment, we will call you to review the results.   Testing/Procedures: None   Follow-Up: At [facility], you and your health needs are our priority.  As part of our continuing mission to provide you with exceptional heart care, we have created designated Provider Care Teams.  These Care Teams include your primary Cardiologist (physician) and Advanced Practice Providers (APPs -  Physician Assistants and Nurse Practitioners) who all work together to provide you with the care you need, when you need it.  We recommend signing up for the patient portal called "MyChart".  Sign up information is provided on this After Visit Summary.  MyChart is used to connect with patients for Virtual Visits (Telemedicine).  Patients are able to view lab/test results, encounter notes, upcoming appointments, etc.  Non-urgent messages can be sent to your provider as well.   To learn more about what you can do with MyChart, go to [email or web address].    Your next appointment:   6 month(s)  Provider:   Wallis Bamberg, NP  Other Instructions None

## 2023-03-31 DIAGNOSIS — L570 Actinic keratosis: Secondary | ICD-10-CM | POA: Diagnosis not present

## 2023-03-31 DIAGNOSIS — C44311 Basal cell carcinoma of skin of nose: Secondary | ICD-10-CM | POA: Diagnosis not present

## 2023-03-31 DIAGNOSIS — L820 Inflamed seborrheic keratosis: Secondary | ICD-10-CM | POA: Diagnosis not present

## 2023-03-31 DIAGNOSIS — L578 Other skin changes due to chronic exposure to nonionizing radiation: Secondary | ICD-10-CM | POA: Diagnosis not present

## 2023-03-31 DIAGNOSIS — L300 Nummular dermatitis: Secondary | ICD-10-CM | POA: Diagnosis not present

## 2023-03-31 DIAGNOSIS — L821 Other seborrheic keratosis: Secondary | ICD-10-CM | POA: Diagnosis not present

## 2023-04-11 DIAGNOSIS — C44311 Basal cell carcinoma of skin of nose: Secondary | ICD-10-CM | POA: Diagnosis not present

## 2023-05-17 DIAGNOSIS — L578 Other skin changes due to chronic exposure to nonionizing radiation: Secondary | ICD-10-CM | POA: Diagnosis not present

## 2023-05-17 DIAGNOSIS — L821 Other seborrheic keratosis: Secondary | ICD-10-CM | POA: Diagnosis not present

## 2023-05-17 DIAGNOSIS — L820 Inflamed seborrheic keratosis: Secondary | ICD-10-CM | POA: Diagnosis not present

## 2023-05-17 DIAGNOSIS — C44311 Basal cell carcinoma of skin of nose: Secondary | ICD-10-CM | POA: Diagnosis not present

## 2023-05-18 ENCOUNTER — Encounter (INDEPENDENT_AMBULATORY_CARE_PROVIDER_SITE_OTHER): Payer: Medicare Other | Admitting: Ophthalmology

## 2023-05-18 DIAGNOSIS — H353132 Nonexudative age-related macular degeneration, bilateral, intermediate dry stage: Secondary | ICD-10-CM

## 2023-05-18 DIAGNOSIS — H34812 Central retinal vein occlusion, left eye, with macular edema: Secondary | ICD-10-CM

## 2023-05-18 DIAGNOSIS — H43813 Vitreous degeneration, bilateral: Secondary | ICD-10-CM | POA: Diagnosis not present

## 2023-06-14 DIAGNOSIS — R7301 Impaired fasting glucose: Secondary | ICD-10-CM | POA: Diagnosis not present

## 2023-06-14 DIAGNOSIS — Z6820 Body mass index (BMI) 20.0-20.9, adult: Secondary | ICD-10-CM | POA: Diagnosis not present

## 2023-06-14 DIAGNOSIS — D72819 Decreased white blood cell count, unspecified: Secondary | ICD-10-CM | POA: Diagnosis not present

## 2023-06-14 DIAGNOSIS — E785 Hyperlipidemia, unspecified: Secondary | ICD-10-CM | POA: Diagnosis not present

## 2023-06-14 DIAGNOSIS — K5909 Other constipation: Secondary | ICD-10-CM | POA: Diagnosis not present

## 2023-06-14 DIAGNOSIS — R1032 Left lower quadrant pain: Secondary | ICD-10-CM | POA: Diagnosis not present

## 2023-06-14 DIAGNOSIS — I480 Paroxysmal atrial fibrillation: Secondary | ICD-10-CM | POA: Diagnosis not present

## 2023-06-15 LAB — LAB REPORT - SCANNED
A1c: 6
EGFR: 51

## 2023-06-17 DIAGNOSIS — Z9049 Acquired absence of other specified parts of digestive tract: Secondary | ICD-10-CM | POA: Diagnosis not present

## 2023-06-17 DIAGNOSIS — R1032 Left lower quadrant pain: Secondary | ICD-10-CM | POA: Diagnosis not present

## 2023-07-05 ENCOUNTER — Ambulatory Visit (HOSPITAL_BASED_OUTPATIENT_CLINIC_OR_DEPARTMENT_OTHER)
Admission: EM | Admit: 2023-07-05 | Discharge: 2023-07-05 | Disposition: A | Discharge status: Home / Self Care | Attending: Family Medicine | Admitting: Family Medicine

## 2023-07-05 ENCOUNTER — Encounter (HOSPITAL_BASED_OUTPATIENT_CLINIC_OR_DEPARTMENT_OTHER): Payer: Self-pay | Admitting: Emergency Medicine

## 2023-07-05 ENCOUNTER — Ambulatory Visit (HOSPITAL_BASED_OUTPATIENT_CLINIC_OR_DEPARTMENT_OTHER)
Admit: 2023-07-05 | Discharge: 2023-07-05 | Disposition: A | Discharge status: Home / Self Care | Attending: Family Medicine | Admitting: Family Medicine

## 2023-07-05 ENCOUNTER — Ambulatory Visit (HOSPITAL_BASED_OUTPATIENT_CLINIC_OR_DEPARTMENT_OTHER): Payer: Self-pay | Admitting: Family Medicine

## 2023-07-05 DIAGNOSIS — M25551 Pain in right hip: Secondary | ICD-10-CM | POA: Diagnosis not present

## 2023-07-05 DIAGNOSIS — W109XXA Fall (on) (from) unspecified stairs and steps, initial encounter: Secondary | ICD-10-CM | POA: Diagnosis not present

## 2023-07-05 DIAGNOSIS — M160 Bilateral primary osteoarthritis of hip: Secondary | ICD-10-CM | POA: Diagnosis not present

## 2023-07-05 DIAGNOSIS — Z23 Encounter for immunization: Secondary | ICD-10-CM

## 2023-07-05 DIAGNOSIS — M47816 Spondylosis without myelopathy or radiculopathy, lumbar region: Secondary | ICD-10-CM | POA: Diagnosis not present

## 2023-07-05 DIAGNOSIS — S72114A Nondisplaced fracture of greater trochanter of right femur, initial encounter for closed fracture: Secondary | ICD-10-CM | POA: Diagnosis not present

## 2023-07-05 DIAGNOSIS — W19XXXA Unspecified fall, initial encounter: Secondary | ICD-10-CM

## 2023-07-05 MED ORDER — TETANUS-DIPHTH-ACELL PERTUSSIS 5-2.5-18.5 LF-MCG/0.5 IM SUSY
0.5000 mL | PREFILLED_SYRINGE | Freq: Once | INTRAMUSCULAR | Status: AC
  Administered 2023-07-05: 0.5 mL via INTRAMUSCULAR

## 2023-07-05 NOTE — ED Provider Notes (Signed)
 [facility]    CSN: [id number] Arrival date & time: 07/05/23  1149      History   Chief Complaint Chief Complaint  Patient presents with   Hip Pain    HPI Jason Mcconnell is a 89 y.o. male.   89 year old male presents today with right hip pain after a fall.  Reports that he slipped on the stairs and fell landing on the right arm, right hip and right leg.  The worst complaint is his hip.  He is able to ambulate but with some discomfort.  He has skin tear to the right elbow and wound to the right lower extremity.  He has not take anything for the pain.  Denies any numbness, tingling.  Does have some weakness in the leg when he is ambulating.   Hip Pain    Past Medical History:  Diagnosis Date   Arthritis    Atrial fibrillation (HCC)    a. Dx 08/02/18 at [facility]   Back pain    Coronary artery calcification seen on CT scan    EPIDIDYMAL CYST 12/19/2006   Qualifier: Diagnosis of  By: Edmonia Gottron MD, Beauford Bounds    Fracture of distal end of radius 11/12/2017   GERD (gastroesophageal reflux disease)    Ingrown nail 07/24/2015   Leukopenia 09/06/2021   Other malaise and fatigue 09/13/2007   Qualifier: Diagnosis of  By: Edmonia Gottron MD, Beauford Bounds    Pain in soft tissues of limb 07/24/2015   Wrist pain 08/21/2018    Patient Active Problem List   Diagnosis Date Noted   Leukopenia 09/06/2021   Wrist pain 08/21/2018   GERD (gastroesophageal reflux disease)    Coronary artery calcification seen on CT scan    Atrial fibrillation (HCC)    Back pain    Arthritis    Fracture of distal end of radius 11/12/2017   Ingrown nail 07/24/2015   Pain in soft tissues of limb 07/24/2015   OTHER MALAISE AND FATIGUE 09/13/2007   EPIDIDYMAL CYST 12/19/2006    Past Surgical History:  Procedure Laterality Date   CARDIAC CATHETERIZATION Bilateral    CATARACT EXTRACTION Bilateral    CHOLECYSTECTOMY     MENISCUS REPAIR Right    POLYPECTOMY     right wrist frac      UMBILICAL HERNIA REPAIR         Home Medications    Prior to Admission medications   Medication Sig Start Date End Date Taking? Authorizing Provider  apixaban  (ELIQUIS ) 5 MG TABS tablet Take 1 tablet (5 mg total) by mouth 2 (two) times daily. 06/17/22   Hassan Links, MD  diltiazem  (CARDIZEM ) 30 MG tablet Take 1 tablet (30 mg total) by mouth 2 (two) times daily. 09/17/22   Hassan Links, MD    Family History Family History  Problem Relation Age of Onset   COPD Mother    Alcohol abuse Father    Heart failure Father    Ovarian cancer Maternal Grandmother     Social History Social History   Tobacco Use   Smoking status: Former    Current packs/day: 0.00    Types: Cigarettes    Quit date: 08/20/1988    Years since quitting: 34.8   Smokeless tobacco: Former    Types: Engineer, drilling   Vaping status: Never Used  Substance Use Topics   Alcohol use: Yes    Comment: Very Seldom   Drug use: Never     Allergies  Patient has no known allergies.   Review of Systems Review of Systems See HPI  Physical Exam Triage Vital Signs ED Triage Vitals  Encounter Vitals Group     BP 07/05/23 1226 128/73     Girls Systolic BP Percentile --      Girls Diastolic BP Percentile --      Boys Systolic BP Percentile --      Boys Diastolic BP Percentile --      Pulse Rate 07/05/23 1226 74     Resp 07/05/23 1226 18     Temp 07/05/23 1226 98.1 F (36.7 C)     Temp Source 07/05/23 1226 Oral     SpO2 07/05/23 1226 98 %     Weight --      Height --      Head Circumference --      Peak Flow --      Pain Score 07/05/23 1230 0     Pain Loc --      Pain Education --      Exclude from Growth Chart --    No data found.  Updated Vital Signs BP 128/73 (BP Location: Right Arm)   Pulse 74   Temp 98.1 F (36.7 C) (Oral)   Resp 18   SpO2 98%   Visual Acuity Right Eye Distance:   Left Eye Distance:   Bilateral Distance:    Right Eye Near:   Left Eye Near:    Bilateral  Near:     Physical Exam Constitutional:      Appearance: Normal appearance.  Pulmonary:     Effort: Pulmonary effort is normal.   Musculoskeletal:        General: Normal range of motion.     Comments: TTP hip joint. Limited ROM specifically with extension.    Skin:    Comments: Skin tear to left elbow and small puncture to right lower extremity   Neurological:     Mental Status: He is alert.   Psychiatric:        Mood and Affect: Mood normal.      UC Treatments / Results  Labs (all labs ordered are listed, but only abnormal results are displayed) Labs Reviewed - No data to display  EKG   Radiology DG Hip Unilat With Pelvis 2-3 Views Right Result Date: 07/05/2023 CLINICAL DATA:  Status post fall EXAM: DG HIP (WITH OR WITHOUT PELVIS) 3V RIGHT COMPARISON:  CT abdomen and pelvis dated 06/17/2023 FINDINGS: Cortical discontinuity involving the right greater trochanter. Fracture lucency likely extends into the medial femoral neck. No acute dislocation. Partially imaged degenerative changes of the lower lumbar spine. Degenerative changes of the bilateral hips. Left lower quadrant linear radiodensities may be postsurgical. IMPRESSION: Nondisplaced fracture of the right greater trochanter with likely extension into the medial femoral neck. Electronically Signed   By: Limin  Xu M.D.   On: 07/05/2023 14:21    Procedures Procedures (including critical care time)  Medications Ordered in UC Medications  Tdap (BOOSTRIX) injection 0.5 mL (0.5 mLs Intramuscular Given 07/05/23 1357)    Initial Impression / Assessment and Plan / UC Course  I have reviewed the triage vital signs and the nursing notes.  Pertinent labs & imaging results that were available during my care of the patient were reviewed by me and considered in my medical decision making (see chart for details).     Right hip pain-patient with positive nondisplaced fracture of the right greater trochanter with likely extension  into the  medial femoral neck.  Patient left prior to x-ray being read by the radiologist.  Patient called and made aware of result findings and positive fracture.  Information given for [facility] for follow-up.  Recommend call them today to schedule an appointment for further evaluation and management of this. Tylenol for pain as needed. Wounds cleaned here in clinic and dressings applied with antibiotic ointment. Follow-up for any continued concerns Final Clinical Impressions(s) / UC Diagnoses   Final diagnoses:  Fall, initial encounter  Pain of right hip     Discharge Instructions      I am not seeing anything concerning on your hip x-ray.  He can take Tylenol for pain as needed.  I will call you if anything else is reported on the official x-ray results.  You can put some ice on the hip a few times a day for 15 to 20 minutes at a time. Keep the wounds clean and antibiotic ointment on the wounds Follow-up as needed    ED Prescriptions   None    PDMP not reviewed this encounter.   Landa Pine, FNP 07/05/23 1531

## 2023-07-05 NOTE — Discharge Instructions (Signed)
 I am not seeing anything concerning on your hip x-ray.  He can take Tylenol for pain as needed.  I will call you if anything else is reported on the official x-ray results.  You can put some ice on the hip a few times a day for 15 to 20 minutes at a time. Keep the wounds clean and antibiotic ointment on the wounds Follow-up as needed

## 2023-07-05 NOTE — ED Triage Notes (Signed)
 Pts st's he was going down two steps and caught the heel of his shoe causing him to fall 2 days ago.  Pt denies LOC or hitting his head.  St's he has had continued pain in right hip with ambulation or lifting his right leg.

## 2023-07-07 DIAGNOSIS — M25551 Pain in right hip: Secondary | ICD-10-CM | POA: Diagnosis not present

## 2023-07-10 DIAGNOSIS — M25551 Pain in right hip: Secondary | ICD-10-CM | POA: Diagnosis not present

## 2023-07-11 ENCOUNTER — Inpatient Hospital Stay (HOSPITAL_COMMUNITY)
Admission: EM | Admit: 2023-07-11 | Discharge: 2023-07-15 | DRG: 481 | Disposition: A | Discharge status: Home / Self Care | Attending: Internal Medicine | Admitting: Internal Medicine

## 2023-07-11 ENCOUNTER — Encounter (HOSPITAL_COMMUNITY): Payer: Self-pay | Admitting: *Deleted

## 2023-07-11 ENCOUNTER — Other Ambulatory Visit: Payer: Self-pay

## 2023-07-11 ENCOUNTER — Inpatient Hospital Stay (HOSPITAL_COMMUNITY)

## 2023-07-11 DIAGNOSIS — W108XXA Fall (on) (from) other stairs and steps, initial encounter: Secondary | ICD-10-CM | POA: Diagnosis not present

## 2023-07-11 DIAGNOSIS — Z87891 Personal history of nicotine dependence: Secondary | ICD-10-CM

## 2023-07-11 DIAGNOSIS — Z79899 Other long term (current) drug therapy: Secondary | ICD-10-CM | POA: Diagnosis not present

## 2023-07-11 DIAGNOSIS — S72001A Fracture of unspecified part of neck of right femur, initial encounter for closed fracture: Secondary | ICD-10-CM | POA: Diagnosis not present

## 2023-07-11 DIAGNOSIS — W109XXA Fall (on) (from) unspecified stairs and steps, initial encounter: Secondary | ICD-10-CM | POA: Diagnosis present

## 2023-07-11 DIAGNOSIS — Z825 Family history of asthma and other chronic lower respiratory diseases: Secondary | ICD-10-CM

## 2023-07-11 DIAGNOSIS — Z8249 Family history of ischemic heart disease and other diseases of the circulatory system: Secondary | ICD-10-CM | POA: Diagnosis not present

## 2023-07-11 DIAGNOSIS — I2510 Atherosclerotic heart disease of native coronary artery without angina pectoris: Secondary | ICD-10-CM | POA: Diagnosis present

## 2023-07-11 DIAGNOSIS — M160 Bilateral primary osteoarthritis of hip: Secondary | ICD-10-CM | POA: Diagnosis not present

## 2023-07-11 DIAGNOSIS — I480 Paroxysmal atrial fibrillation: Secondary | ICD-10-CM | POA: Diagnosis not present

## 2023-07-11 DIAGNOSIS — D61818 Other pancytopenia: Secondary | ICD-10-CM | POA: Diagnosis not present

## 2023-07-11 DIAGNOSIS — S72144A Nondisplaced intertrochanteric fracture of right femur, initial encounter for closed fracture: Principal | ICD-10-CM | POA: Diagnosis present

## 2023-07-11 DIAGNOSIS — I4891 Unspecified atrial fibrillation: Secondary | ICD-10-CM | POA: Diagnosis not present

## 2023-07-11 DIAGNOSIS — K219 Gastro-esophageal reflux disease without esophagitis: Secondary | ICD-10-CM | POA: Diagnosis not present

## 2023-07-11 DIAGNOSIS — R9431 Abnormal electrocardiogram [ECG] [EKG]: Secondary | ICD-10-CM | POA: Diagnosis not present

## 2023-07-11 DIAGNOSIS — D689 Coagulation defect, unspecified: Secondary | ICD-10-CM | POA: Diagnosis present

## 2023-07-11 DIAGNOSIS — M47816 Spondylosis without myelopathy or radiculopathy, lumbar region: Secondary | ICD-10-CM | POA: Diagnosis not present

## 2023-07-11 DIAGNOSIS — Z7901 Long term (current) use of anticoagulants: Secondary | ICD-10-CM | POA: Diagnosis not present

## 2023-07-11 DIAGNOSIS — S72114A Nondisplaced fracture of greater trochanter of right femur, initial encounter for closed fracture: Secondary | ICD-10-CM | POA: Diagnosis not present

## 2023-07-11 DIAGNOSIS — Z01818 Encounter for other preprocedural examination: Secondary | ICD-10-CM | POA: Diagnosis not present

## 2023-07-11 DIAGNOSIS — Z9889 Other specified postprocedural states: Secondary | ICD-10-CM | POA: Diagnosis not present

## 2023-07-11 DIAGNOSIS — I2584 Coronary atherosclerosis due to calcified coronary lesion: Secondary | ICD-10-CM | POA: Diagnosis not present

## 2023-07-11 DIAGNOSIS — S72009A Fracture of unspecified part of neck of unspecified femur, initial encounter for closed fracture: Secondary | ICD-10-CM | POA: Diagnosis not present

## 2023-07-11 HISTORY — DX: Fracture of unspecified part of neck of right femur, initial encounter for closed fracture: S72.001A

## 2023-07-11 LAB — COMPREHENSIVE METABOLIC PANEL WITH GFR
ALT: 10 U/L (ref 0–44)
AST: 17 U/L (ref 15–41)
Albumin: 3.5 g/dL (ref 3.5–5.0)
Alkaline Phosphatase: 84 U/L (ref 38–126)
Anion gap: 10 (ref 5–15)
BUN: 17 mg/dL (ref 8–23)
CO2: 25 mmol/L (ref 22–32)
Calcium: 8.9 mg/dL (ref 8.9–10.3)
Chloride: 99 mmol/L (ref 98–111)
Creatinine, Ser: 1.08 mg/dL (ref 0.61–1.24)
GFR, Estimated: >60 mL/min (ref >60)
Glucose, Bld: 101 mg/dL — ABNORMAL HIGH (ref 70–99)
Potassium: 4.2 mmol/L (ref 3.5–5.1)
Sodium: 134 mmol/L — ABNORMAL LOW (ref 135–145)
Total Bilirubin: 0.8 mg/dL (ref 0.0–1.2)
Total Protein: 6.4 g/dL — ABNORMAL LOW (ref 6.5–8.1)

## 2023-07-11 LAB — CBC WITH DIFFERENTIAL/PLATELET
Abs Immature Granulocytes: 0.01 10*3/uL (ref 0.00–0.07)
Basophils Absolute: 0 10*3/uL (ref 0.0–0.1)
Basophils Relative: 0 %
Eosinophils Absolute: 0 10*3/uL (ref 0.0–0.5)
Eosinophils Relative: 1 %
HCT: 31.6 % — ABNORMAL LOW (ref 39.0–52.0)
Hemoglobin: 10 g/dL — ABNORMAL LOW (ref 13.0–17.0)
Immature Granulocytes: 1 %
Lymphocytes Relative: 35 %
Lymphs Abs: 0.6 10*3/uL — ABNORMAL LOW (ref 0.7–4.0)
MCH: 32.6 pg (ref 26.0–34.0)
MCHC: 31.6 g/dL (ref 30.0–36.0)
MCV: 102.9 fL — ABNORMAL HIGH (ref 80.0–100.0)
Monocytes Absolute: 0.3 10*3/uL (ref 0.1–1.0)
Monocytes Relative: 14 %
Neutro Abs: 0.9 10*3/uL — ABNORMAL LOW (ref 1.7–7.7)
Neutrophils Relative %: 49 %
Platelets: 146 10*3/uL — ABNORMAL LOW (ref 150–400)
RBC: 3.07 MIL/uL — ABNORMAL LOW (ref 4.22–5.81)
RDW: 14.6 % (ref 11.5–15.5)
WBC: 1.9 10*3/uL — ABNORMAL LOW (ref 4.0–10.5)
nRBC: 0 % (ref 0.0–0.2)

## 2023-07-11 LAB — PROTIME-INR
INR: 1.3 — ABNORMAL HIGH (ref 0.8–1.2)
Prothrombin Time: 16.8 s — ABNORMAL HIGH (ref 11.4–15.2)

## 2023-07-11 MED ORDER — ONDANSETRON HCL 4 MG PO TABS: 4.0000 mg | ORAL_TABLET | Freq: Four times a day (QID) | ORAL | Status: DC | PRN

## 2023-07-11 MED ORDER — ALBUTEROL SULFATE (2.5 MG/3ML) 0.083% IN NEBU: 2.5000 mg | INHALATION_SOLUTION | RESPIRATORY_TRACT | Status: DC | PRN

## 2023-07-11 MED ORDER — ENOXAPARIN SODIUM 40 MG/0.4ML IJ SOSY
40.0000 mg | PREFILLED_SYRINGE | INTRAMUSCULAR | Status: DC
  Administered 2023-07-11 – 2023-07-12 (×2): 40 mg via SUBCUTANEOUS
  Filled 2023-07-11 (×2): qty 0.4

## 2023-07-11 MED ORDER — ACETAMINOPHEN 325 MG PO TABS
650.0000 mg | ORAL_TABLET | Freq: Four times a day (QID) | ORAL | Status: DC | PRN
Start: 2023-07-11 — End: 2023-07-15
  Administered 2023-07-15: 650 mg via ORAL
  Filled 2023-07-11: qty 2

## 2023-07-11 MED ORDER — TRAZODONE HCL 50 MG PO TABS
25.0000 mg | ORAL_TABLET | Freq: Every evening | ORAL | Status: DC | PRN
  Administered 2023-07-13: 25 mg via ORAL
  Filled 2023-07-11: qty 1

## 2023-07-11 MED ORDER — ONDANSETRON HCL 4 MG/2ML IJ SOLN: 4.0000 mg | Freq: Four times a day (QID) | INTRAMUSCULAR | Status: DC | PRN

## 2023-07-11 MED ORDER — ACETAMINOPHEN 650 MG RE SUPP: 650.0000 mg | Freq: Four times a day (QID) | RECTAL | Status: DC | PRN

## 2023-07-11 MED ORDER — OXYCODONE HCL 5 MG PO TABS: 5.0000 mg | ORAL_TABLET | ORAL | Status: DC | PRN

## 2023-07-11 NOTE — ED Notes (Signed)
 Floor nurse called

## 2023-07-11 NOTE — ED Triage Notes (Signed)
 BIB family from home s/p fall 1 week ago and R hip pain/ fx. Seen at [facility] UC at that time. Seen today and sent from [facility] to ED for admission for R hip fx. States, Will need surgery, but takes a blood thinner for afib. C/o R hip pain with use/ movement. No pain when still. Walks with cane, ambulatory. Alert, NAD, calm, interactive. Describes initial fall as tripped, did not hit head. Denies NVD, fever, sob, CP, dizziness. Abrasions noted to R leg.

## 2023-07-11 NOTE — Plan of Care (Signed)
   Problem: Activity: Goal: Risk for activity intolerance will decrease Outcome: Progressing   Problem: Nutrition: Goal: Adequate nutrition will be maintained Outcome: Progressing   Problem: Pain Managment: Goal: General experience of comfort will improve and/or be controlled Outcome: Progressing   Problem: Safety: Goal: Ability to remain free from injury will improve Outcome: Progressing

## 2023-07-11 NOTE — H&P (Signed)
 History and Physical  Jason Mcconnell FMW:[id number] DOB: 1931/01/05 DOA: 07/11/2023  PCP: Keren Vicenta BRAVO, MD   Chief Complaint: Right hip fracture  HPI: Jason Mcconnell is a 89 y.o. male with medical history significant for atrial fibrillation on diltiazem  and Eliquis  last dose 6/23 being admitted to the hospital with traumatic right hip fracture.  Patient fell approximately 1 week ago, was seen in urgent care on the following day with x-rays and discharged home.  He has been ambulating, followed up with [facility] late last week and based on MRI done over the weekend was sent for admission to [facility] by orthopedic surgery Dr. Fidel, for planned surgical repair later this week.  Patient has been feeling well, has no other complaints.  He has no pain except when he is ambulating.  Denies chest pain, loss of consciousness, or hitting his head.  He fell when he was walking down some steps outside, and feels that his heel got stuck on the last step.  Review of Systems: Please see HPI for pertinent positives and negatives. A complete 10 system review of systems are otherwise negative.  Past Medical History:  Diagnosis Date   Arthritis    Atrial fibrillation (HCC)    a. Dx 08/02/18 at [facility]   Back pain    Coronary artery calcification seen on CT scan    EPIDIDYMAL CYST 12/19/2006   Qualifier: Diagnosis of  By: Harlow MD, Ozell BRAVO    Fracture of distal end of radius 11/12/2017   GERD (gastroesophageal reflux disease)    Ingrown nail 07/24/2015   Leukopenia 09/06/2021   Other malaise and fatigue 09/13/2007   Qualifier: Diagnosis of  By: Harlow MD, Ozell BRAVO    Pain in soft tissues of limb 07/24/2015   Wrist pain 08/21/2018   Past Surgical History:  Procedure Laterality Date   CARDIAC CATHETERIZATION Bilateral    CATARACT EXTRACTION Bilateral    CHOLECYSTECTOMY     MENISCUS REPAIR Right    POLYPECTOMY     right wrist frac      UMBILICAL HERNIA REPAIR     Social History:  reports that he quit smoking about 34 years ago. His smoking use included cigarettes. He has quit using smokeless tobacco.  His smokeless tobacco use included chew. He reports current alcohol use. He reports that he does not use drugs.  No Known Allergies  Family History  Problem Relation Age of Onset   COPD Mother    Alcohol abuse Father    Heart failure Father    Ovarian cancer Maternal Grandmother      Prior to Admission medications   Medication Sig Start Date End Date Taking? Authorizing Provider  apixaban  (ELIQUIS ) 5 MG TABS tablet Take 1 tablet (5 mg total) by mouth 2 (two) times daily. 06/17/22   Monetta Redell PARAS, MD  diltiazem  (CARDIZEM ) 30 MG tablet Take 1 tablet (30 mg total) by mouth 2 (two) times daily. 09/17/22   Monetta Redell PARAS, MD  lubiprostone (AMITIZA) 24 MCG capsule Take 24 mcg by mouth. 03/03/23   [provider]    Physical Exam: BP 134/68   Pulse 72   Temp 97.8 F (36.6 C)   Resp 14   Ht 6' (1.829 m)   Wt 68 kg   SpO2 99%   BMI 20.34 kg/m  General:  Alert, oriented, calm, in no acute distress  Eyes: EOMI, clear conjuctivae, white sclerea Neck: supple, no masses, trachea mildline  Cardiovascular: RRR, no  murmurs or rubs, no peripheral edema  Respiratory: clear to auscultation bilaterally, no wheezes, no crackles  Abdomen: soft, nontender, nondistended, normal bowel tones heard  Skin: dry, no rashes  Musculoskeletal: no joint effusions, normal range of motion  Psychiatric: appropriate affect, normal speech  Neurologic: extraocular muscles intact, clear speech, moving all extremities with intact sensorium         Labs on Admission:  Basic Metabolic Panel: Recent Labs  Lab 07/11/23 1328  NA 134*  K 4.2  CL 99  CO2 25  GLUCOSE 101*  BUN 17  CREATININE 1.08  CALCIUM 8.9   Liver Function Tests: Recent Labs  Lab 07/11/23 1328  AST 17  ALT 10  ALKPHOS 84  BILITOT 0.8  PROT 6.4*  ALBUMIN  3.5   No results for input(s): LIPASE, AMYLASE in the last 168 hours. No results for input(s): AMMONIA in the last 168 hours. CBC: Recent Labs  Lab 07/11/23 1328  WBC 1.9*  NEUTROABS PENDING  HGB 10.0*  HCT 31.6*  MCV 102.9*  PLT 146*   Cardiac Enzymes: No results for input(s): CKTOTAL, CKMB, CKMBINDEX, TROPONINI in the last 168 hours. BNP (last 3 results) No results for input(s): BNP in the last 8760 hours.  ProBNP (last 3 results) No results for input(s): PROBNP in the last 8760 hours.  CBG: No results for input(s): GLUCAP in the last 168 hours.  Radiological Exams on Admission: No results found. Assessment/Plan Jason Mcconnell is a 89 y.o. male with medical history significant for atrial fibrillation on diltiazem  and Eliquis  last dose 6/23 being admitted to the hospital with traumatic right hip fracture.  Right hip fracture-seen on x-ray 6/17 and on MRI which was done over the weekend -Inpatient admission -Pain control -Nonweightbearing -Plan surgical repair 6/25 or 6/26, will need to be made n.p.o. night prior -Will hold Eliquis  in anticipation of surgery  Atrial fibrillation-hold Eliquis , continue Cardizem   DVT prophylaxis: Lovenox     Code Status: Full Code  Consults called: [place], orthopedics  Admission status: The appropriate patient status for this patient is INPATIENT. Inpatient status is judged to be reasonable and necessary in order to provide the required intensity of service to ensure the patient's safety. The patient's presenting symptoms, physical exam findings, and initial radiographic and laboratory data in the context of their chronic comorbidities is felt to place them at high risk for further clinical deterioration. Furthermore, it is not anticipated that the patient will be medically stable for discharge from the hospital within 2 midnights of admission.    I certify that at the point of admission it is my clinical  judgment that the patient will require inpatient hospital care spanning beyond 2 midnights from the point of admission due to high intensity of service, high risk for further deterioration and high frequency of surveillance required  Time spent: 56 minutes  Jason Cooks CHRISTELLA Gail MD [facility] Pager [phone]  If 7PM-7AM, please contact night-coverage [email or web address] Password [email or web address]  07/11/2023, 2:04 PM

## 2023-07-11 NOTE — ED Provider Notes (Signed)
 [place] EMERGENCY DEPARTMENT AT [facility] Provider Note   CSN: [id number] Arrival date & time: 07/11/23  1106     Patient presents with: Jason Mcconnell is a 89 y.o. male.   Patient with known right hip fracture presents on the advice of [facility]. He had a mechanical fall one week ago, fell onto the right side and has had pain with ambulating since that time. He is using a cane. He denies other injury. No chest pain, SoB, abdominal pain, vomiting, or back/neck pain. He is on Eliquis  for atrial fibrillation and took his last dose this morning (07/11/23).  The history is provided by the patient and the spouse. No language interpreter was used.  Fall       Prior to Admission medications   Medication Sig Start Date End Date Taking? Authorizing Provider  apixaban  (ELIQUIS ) 5 MG TABS tablet Take 1 tablet (5 mg total) by mouth 2 (two) times daily. 06/17/22   Monetta Redell PARAS, MD  diltiazem  (CARDIZEM ) 30 MG tablet Take 1 tablet (30 mg total) by mouth 2 (two) times daily. 09/17/22   Monetta Redell PARAS, MD  lubiprostone (AMITIZA) 24 MCG capsule Take 24 mcg by mouth. 03/03/23   [provider]    Allergies: Patient has no known allergies.    Review of Systems  Updated Vital Signs BP 134/68   Pulse 72   Temp 97.8 F (36.6 C)   Resp 14   Ht 6' (1.829 m)   Wt 68 kg   SpO2 99%   BMI 20.34 kg/m   Physical Exam Vitals and nursing note reviewed.  Constitutional:      General: He is not in acute distress.    Appearance: He is well-developed. He is not ill-appearing.  HENT:     Head: Normocephalic and atraumatic.  Neck:     Comments: No midline cervical tenderness.  Cardiovascular:     Rate and Rhythm: Normal rate and regular rhythm.     Heart sounds: No murmur heard. Pulmonary:     Effort: Pulmonary effort is normal.     Breath sounds: Normal breath sounds. No wheezing, rhonchi or rales.  Abdominal:     General: Bowel sounds are normal.      Palpations: Abdomen is soft.     Tenderness: There is no abdominal tenderness. There is no guarding or rebound.   Musculoskeletal:        General: Normal range of motion.     Cervical back: Normal range of motion and neck supple.     Comments: No shortening or external rotation of right LE.   Skin:    General: Skin is warm and dry.     Findings: Bruising (Multiple sites in various stages of healing.) present.   Neurological:     General: No focal deficit present.     Mental Status: He is alert and oriented to person, place, and time.     (all labs ordered are listed, but only abnormal results are displayed) Labs Reviewed  CBC WITH DIFFERENTIAL/PLATELET - Abnormal; Notable for the following components:      Result Value   WBC 1.9 (*)    RBC 3.07 (*)    Hemoglobin 10.0 (*)    HCT 31.6 (*)    MCV 102.9 (*)    Platelets 146 (*)    All other components within normal limits  PROTIME-INR  COMPREHENSIVE METABOLIC PANEL WITH GFR    EKG: None  Radiology: No  results found.   Procedures   Medications Ordered in the ED  enoxaparin (LOVENOX) injection 40 mg (has no administration in time range)  acetaminophen (TYLENOL) tablet 650 mg (has no administration in time range)    Or  acetaminophen (TYLENOL) suppository 650 mg (has no administration in time range)  oxyCODONE (Oxy IR/ROXICODONE) immediate release tablet 5 mg (has no administration in time range)  traZODone (DESYREL) tablet 25 mg (has no administration in time range)  ondansetron (ZOFRAN) tablet 4 mg (has no administration in time range)    Or  ondansetron (ZOFRAN) injection 4 mg (has no administration in time range)  albuterol (PROVENTIL) (2.5 MG/3ML) 0.083% nebulizer solution 2.5 mg (has no administration in time range)    Clinical Course as of 07/11/23 1358  Mon Jul 11, 2023  1234 Patient sent to ED by orthopedics for medical admission for surgical treatment of right hip fracture. The patient has no  complaints, is comfortable with at rest, reports no other injuries. Labs, EKG, CXR ordered for admission/pre-op.  [SU]  1302 Discussed with Dr. Cherylene who requests a pelvis plain film. He reports surgery will be Wednesday or Thursday of this week. [facility] paged for admission.  [SU]    Clinical Course User Index [SU] Odell Balls, PA-C                                 Medical Decision Making Amount and/or Complexity of Data Reviewed Labs: ordered. Radiology: ordered.  Risk Decision regarding hospitalization.        Final diagnoses:  Closed fracture of right hip, initial encounter [facility])  Coagulopathy [facility])    ED Discharge Orders     None          Odell Balls, PA-C 07/11/23 1358    Suzette Pac, MD 07/14/23 1338

## 2023-07-12 DIAGNOSIS — S72001A Fracture of unspecified part of neck of right femur, initial encounter for closed fracture: Secondary | ICD-10-CM | POA: Diagnosis not present

## 2023-07-12 LAB — BASIC METABOLIC PANEL WITH GFR
Anion gap: 10 (ref 5–15)
BUN: 17 mg/dL (ref 8–23)
CO2: 25 mmol/L (ref 22–32)
Calcium: 9.1 mg/dL (ref 8.9–10.3)
Chloride: 104 mmol/L (ref 98–111)
Creatinine, Ser: 1.07 mg/dL (ref 0.61–1.24)
GFR, Estimated: >60 mL/min (ref >60)
Glucose, Bld: 106 mg/dL — ABNORMAL HIGH (ref 70–99)
Potassium: 4.2 mmol/L (ref 3.5–5.1)
Sodium: 139 mmol/L (ref 135–145)

## 2023-07-12 LAB — CBC
HCT: 29 % — ABNORMAL LOW (ref 39.0–52.0)
Hemoglobin: 9.2 g/dL — ABNORMAL LOW (ref 13.0–17.0)
MCH: 31.7 pg (ref 26.0–34.0)
MCHC: 31.7 g/dL (ref 30.0–36.0)
MCV: 100 fL (ref 80.0–100.0)
Platelets: 148 10*3/uL — ABNORMAL LOW (ref 150–400)
RBC: 2.9 MIL/uL — ABNORMAL LOW (ref 4.22–5.81)
RDW: 14.6 % (ref 11.5–15.5)
WBC: 1.9 10*3/uL — ABNORMAL LOW (ref 4.0–10.5)
nRBC: 0 % (ref 0.0–0.2)

## 2023-07-12 NOTE — Plan of Care (Signed)
  Problem: Activity: Goal: Risk for activity intolerance will decrease Outcome: Progressing   Problem: Safety: Goal: Ability to remain free from injury will improve Outcome: Progressing   

## 2023-07-12 NOTE — Consult Note (Signed)
 ORTHOPAEDIC CONSULTATION  REQUESTING PHYSICIAN: Drusilla Sabas RAMAN, Mcconnell  PCP:  Jason Vicenta BRAVO, Mcconnell  Chief Complaint: Right hip pain, fall  HPI: Jason Mcconnell is a 89 y.o. male medical history significant for atrial fibrillation on diltiazem  and Eliquis  last dose 6/23 being admitted to the hospital with traumatic right hip fracture. Patient fell 07/03/23 down a few stairs onto concrete on his right side. He had some pain but was able to ambulate. He was seen in urgent care 2 days later with x-rays with concerns for non-displaced fracture, he was ambulating with a cane and followed up with [facility] 07/07/23 where an MRI was ordered. MRI was completed over the weekend, with non-displaced comminuted right intertrochanteric femur fracture. He was sent for admission to [facility]. He reports no pain at rest just with active motion and weightbearing. He denies any tingling or numbness in LE bilaterally. He denies any other injuries.   He lives at home with his wife and usually does not use any assistive devices. Denies any history of DVT, T2DM, kidney or lung issues. Hemoglobin 9.2 this morning.    Past Medical History:  Diagnosis Date   Arthritis    Atrial fibrillation (HCC)    a. Dx 08/02/18 at [facility]   Back pain    Coronary artery calcification seen on CT scan    EPIDIDYMAL CYST 12/19/2006   Qualifier: Diagnosis of  By: Jason Mcconnell, Jason Mcconnell    Fracture of distal end of radius 11/12/2017   GERD (gastroesophageal reflux disease)    Ingrown nail 07/24/2015   Leukopenia 09/06/2021   Other malaise and fatigue 09/13/2007   Qualifier: Diagnosis of  By: Jason Mcconnell, Jason Mcconnell    Pain in soft tissues of limb 07/24/2015   Wrist pain 08/21/2018   Past Surgical History:  Procedure Laterality Date   CARDIAC CATHETERIZATION Bilateral    CATARACT EXTRACTION Bilateral    CHOLECYSTECTOMY     MENISCUS REPAIR Right    POLYPECTOMY     right wrist frac     UMBILICAL  HERNIA REPAIR     Social History   Socioeconomic History   Marital status: Married    Spouse name: Jason Mcconnell    Number of children: 3   Years of education: 12   Highest education level: Not on file  Occupational History   Occupation: RETIRED TRUCK DRIVER    Comment: [employer]  Tobacco Use   Smoking status: Former    Current packs/day: 0.00    Types: Cigarettes    Quit date: 08/20/1988    Years since quitting: 34.9   Smokeless tobacco: Former    Types: Engineer, drilling   Vaping status: Never Used  Substance and Sexual Activity   Alcohol use: Yes    Comment: Very Seldom   Drug use: Never   Sexual activity: Not Currently  Other Topics Concern   Not on file  Social History Narrative   Not on file   Social Drivers of Health   Financial Resource Strain: Not on file  Food Insecurity: No Food Insecurity (07/11/2023)   Hunger Vital Sign    Worried About Running Out of Food in the Last Year: Never true    Ran Out of Food in the Last Year: Never true  Transportation Needs: No Transportation Needs (07/11/2023)   PRAPARE - Administrator, Civil Service (Medical): No    Lack of Transportation (Non-Medical): No  Physical Activity:  Not on file  Stress: Not on file  Social Connections: Socially Integrated (07/11/2023)   Social Connection and Isolation Panel    Frequency of Communication with Friends and Family: Three times a week    Frequency of Social Gatherings with Friends and Family: Twice a week    Attends Religious Services: More than 4 times per year    Active Member of [employer] or Organizations: Yes    Attends Engineer, structural: More than 4 times per year    Marital Status: Married   Family History  Problem Relation Age of Onset   COPD Mother    Alcohol abuse Father    Heart failure Father    Ovarian cancer Maternal Grandmother    No Known Allergies Prior to Admission medications   Medication Sig Start Date End Date  Taking? Authorizing Provider  apixaban  (ELIQUIS ) 5 MG TABS tablet Take 1 tablet (5 mg total) by mouth 2 (two) times daily. 06/17/22  Yes Monetta Redell PARAS, Mcconnell  diltiazem  (CARDIZEM ) 30 MG tablet Take 1 tablet (30 mg total) by mouth 2 (two) times daily. Patient taking differently: Take 30 mg by mouth 2 (two) times daily as needed (Palpitations). 09/17/22  Yes Monetta Redell PARAS, Mcconnell  diphenhydrAMINE (BENADRYL ALLERGY) 25 MG tablet Take 25-50 mg by mouth every 6 (six) hours as needed for itching or allergies.   Yes Provider, Historical, Mcconnell  magnesium hydroxide (MILK OF MAGNESIA) 400 MG/5ML suspension Take 30 mLs by mouth daily as needed for mild constipation.   Yes Provider, Historical, Mcconnell   DG Pelvis 1-2 Views Result Date: 07/11/2023 CLINICAL DATA:  Right hip fracture. EXAM: PELVIS - 1-2 VIEW COMPARISON:  None Available. FINDINGS: Nondisplaced fracture of the greater trochanter of the right hip. No dislocation. Degenerative changes of the lower lumbar spine and mild arthritic changes of the hips. The soft tissues are unremarkable. IMPRESSION: Nondisplaced fracture of the greater trochanter of the right hip. Electronically Signed   By: Jason Mcconnell M.D.   On: 07/11/2023 15:47   DG Chest 2 View Result Date: 07/11/2023 CLINICAL DATA:  Preoperative EXAM: CHEST - 2 VIEW COMPARISON:  Chest x-ray 08/02/2018 FINDINGS: The heart size and mediastinal contours are within normal limits. Both lungs are clear. The visualized skeletal structures are unremarkable. IMPRESSION: No active cardiopulmonary disease. Electronically Signed   By: Jason Mcconnell M.D.   On: 07/11/2023 15:47    Positive ROS: All other systems have been reviewed and were otherwise negative with the exception of those mentioned in the HPI and as above.  Physical Exam: General: Alert, no acute distress Cardiovascular: No pedal edema Respiratory: No cyanosis, no use of accessory musculature GI: No organomegaly, abdomen is soft and non-tender Skin: No  lesions in the area of chief complaint Neurologic: Sensation intact distally Psychiatric: Patient is competent for consent with normal mood and affect Lymphatic: No axillary or cervical lymphadenopathy  MUSCULOSKELETAL:   Examination of the right hip reveals no skin wounds or lesions. No pain with palpation over trochanteric region, or gentle log rolling. Full ROM not performed due to known fracture. No shortening or ER noted.  Sensory and motor function intact in LE bilaterally. Distal pedal pulses 1+ on the right, 2+ on the left. No significant pedal edema. Compartments soft.   Assessment: Non-displaced right intertrochanteric femur fracture.   Plan: I discussed the findings with the patient. He has an unstable right hip fracture that will require surgical fixation for pain control and allow immediate mobilization out of  bed. [facility] has admitted for medical management and perioperative optimization, appreciate their consult. Plan for right IM fixation. He has a-fib with chronic Eliquis  use, last dose 07/11/23 in the morning. Plan for surgery likely Thursday to allow adequate Eliquis  washout. Continue to hold Eliquis . Hold lovenox Wednesday. NPO Wednesday MN. All questions solicited and answered.    Valery GORMAN Potters, PA-C    07/12/2023 8:20 AM

## 2023-07-12 NOTE — Progress Notes (Signed)
 [facility]  PROGRESS NOTE  Griff Badley FMW:[id number] DOB: 1930/02/05 DOA: 07/11/2023 PCP: Keren Vicenta BRAVO, MD   Brief HPI:    89 y.o. male with medical history significant for atrial fibrillation on diltiazem  and Eliquis  last dose 6/23 being admitted to the hospital with traumatic right hip fracture.  Patient fell approximately 1 week ago, was seen in urgent care on the following day with x-rays and discharged home.  He has been ambulating, followed up with [facility] late last week and based on MRI done over the weekend was sent for admission to [facility] by orthopedic surgery Dr. Fidel, for planned surgical repair later this week.    Assessment/Plan:   Right hip fracture -seen on x-ray 6/17 and on MRI which was done over the weekend -Orthopedics consulted, plan for right IM fixationol -Nonweightbearing -Will hold Eliquis  in anticipation of surgery   Atrial fibrillation -hold Eliquis , continue Cardizem   Pancytopenia - Has seen oncologist in the past, no treatment planned - Likely has mild dysplastic syndrome - Continue to monitor    Medications     enoxaparin (LOVENOX) injection  40 mg Subcutaneous Q24H     Data Reviewed:   CBG:  No results for input(s): GLUCAP in the last 168 hours.  SpO2: 96 %    Vitals:   07/11/23 1728 07/11/23 2112 07/12/23 0125 07/12/23 0451  BP:  (!) 165/68 133/63 138/69  Pulse:  68 65 65  Resp:  17 18 18   Temp:  98.1 F (36.7 C) 97.8 F (36.6 C) 97.6 F (36.4 C)  TempSrc:    Oral  SpO2:  99% 97% 96%  Weight:      Height: 6' 0.25 (1.835 m)         Data Reviewed:  Basic Metabolic Panel: Recent Labs  Lab 07/11/23 1328 07/12/23 0332  NA 134* 139  K 4.2 4.2  CL 99 104  CO2 25 25  GLUCOSE 101* 106*  BUN 17 17  CREATININE 1.08 1.07  CALCIUM 8.9 9.1    CBC: Recent Labs  Lab 07/11/23 1328 07/12/23 0332  WBC 1.9* 1.9*  NEUTROABS 0.9*  --   HGB 10.0* 9.2*  HCT 31.6* 29.0*  MCV  102.9* 100.0  PLT 146* 148*    LFT Recent Labs  Lab 07/11/23 1328  AST 17  ALT 10  ALKPHOS 84  BILITOT 0.8  PROT 6.4*  ALBUMIN 3.5     Antibiotics: Anti-infectives (From admission, onward)    None        DVT prophylaxis: Lovenox  Code Status: Full code  Family Communication: No family at bedside   CONSULTS Orthopedics   Subjective   Denies any complaints   Objective    Physical Examination:   General: Appears in no acute distress Cardiovascular: S1-S2, regular, no murmur auscultated Respiratory: Lungs clear to auscultation bilaterally Abdomen: Soft, nontender, no organomegaly Extremities: No edema in the lower extremities Neurologic: Alert, oriented x 3, intact insight and judgment   Status is: Inpatient:             Jason Mcconnell   [facility] If 7PM-7AM, please contact night-coverage at [email or web address], Office  [phone]   07/12/2023, 7:45 AM  LOS: 1 day

## 2023-07-12 NOTE — H&P (View-Only) (Signed)
 ORTHOPAEDIC CONSULTATION  REQUESTING PHYSICIAN: Drusilla Sabas RAMAN, MD  PCP:  Keren Vicenta BRAVO, MD  Chief Complaint: Right hip pain, fall  HPI: Jason Mcconnell is a 89 y.o. male medical history significant for atrial fibrillation on diltiazem  and Eliquis  last dose 6/23 being admitted to the hospital with traumatic right hip fracture. Patient fell 07/03/23 down a few stairs onto concrete on his right side. He had some pain but was able to ambulate. He was seen in urgent care 2 days later with x-rays with concerns for non-displaced fracture, he was ambulating with a cane and followed up with [facility] 07/07/23 where an MRI was ordered. MRI was completed over the weekend, with non-displaced comminuted right intertrochanteric femur fracture. He was sent for admission to [facility]. He reports no pain at rest just with active motion and weightbearing. He denies any tingling or numbness in LE bilaterally. He denies any other injuries.   He lives at home with his wife and usually does not use any assistive devices. Denies any history of DVT, T2DM, kidney or lung issues. Hemoglobin 9.2 this morning.    Past Medical History:  Diagnosis Date   Arthritis    Atrial fibrillation (HCC)    a. Dx 08/02/18 at [facility]   Back pain    Coronary artery calcification seen on CT scan    EPIDIDYMAL CYST 12/19/2006   Qualifier: Diagnosis of  By: Harlow MD, Ozell BRAVO    Fracture of distal end of radius 11/12/2017   GERD (gastroesophageal reflux disease)    Ingrown nail 07/24/2015   Leukopenia 09/06/2021   Other malaise and fatigue 09/13/2007   Qualifier: Diagnosis of  By: Harlow MD, Ozell BRAVO    Pain in soft tissues of limb 07/24/2015   Wrist pain 08/21/2018   Past Surgical History:  Procedure Laterality Date   CARDIAC CATHETERIZATION Bilateral    CATARACT EXTRACTION Bilateral    CHOLECYSTECTOMY     MENISCUS REPAIR Right    POLYPECTOMY     right wrist frac     UMBILICAL  HERNIA REPAIR     Social History   Socioeconomic History   Marital status: Married    Spouse name: [name]    Number of children: 3   Years of education: 12   Highest education level: Not on file  Occupational History   Occupation: RETIRED TRUCK DRIVER    Comment: [employer]  Tobacco Use   Smoking status: Former    Current packs/day: 0.00    Types: Cigarettes    Quit date: 08/20/1988    Years since quitting: 34.9   Smokeless tobacco: Former    Types: Engineer, drilling   Vaping status: Never Used  Substance and Sexual Activity   Alcohol use: Yes    Comment: Very Seldom   Drug use: Never   Sexual activity: Not Currently  Other Topics Concern   Not on file  Social History Narrative   Not on file   Social Drivers of Health   Financial Resource Strain: Not on file  Food Insecurity: No Food Insecurity (07/11/2023)   Hunger Vital Sign    Worried About Running Out of Food in the Last Year: Never true    Ran Out of Food in the Last Year: Never true  Transportation Needs: No Transportation Needs (07/11/2023)   PRAPARE - Administrator, Civil Service (Medical): No    Lack of Transportation (Non-Medical): No  Physical Activity:  Not on file  Stress: Not on file  Social Connections: Socially Integrated (07/11/2023)   Social Connection and Isolation Panel    Frequency of Communication with Friends and Family: Three times a week    Frequency of Social Gatherings with Friends and Family: Twice a week    Attends Religious Services: More than 4 times per year    Active Member of [employer] or Organizations: Yes    Attends Engineer, structural: More than 4 times per year    Marital Status: Married   Family History  Problem Relation Age of Onset   COPD Mother    Alcohol abuse Father    Heart failure Father    Ovarian cancer Maternal Grandmother    No Known Allergies Prior to Admission medications   Medication Sig Start Date End Date  Taking? Authorizing Provider  apixaban  (ELIQUIS ) 5 MG TABS tablet Take 1 tablet (5 mg total) by mouth 2 (two) times daily. 06/17/22  Yes Monetta Redell PARAS, MD  diltiazem  (CARDIZEM ) 30 MG tablet Take 1 tablet (30 mg total) by mouth 2 (two) times daily. Patient taking differently: Take 30 mg by mouth 2 (two) times daily as needed (Palpitations). 09/17/22  Yes Monetta Redell PARAS, MD  diphenhydrAMINE (BENADRYL ALLERGY) 25 MG tablet Take 25-50 mg by mouth every 6 (six) hours as needed for itching or allergies.   Yes [provider]  magnesium hydroxide (MILK OF MAGNESIA) 400 MG/5ML suspension Take 30 mLs by mouth daily as needed for mild constipation.   Yes [provider]   DG Pelvis 1-2 Views Result Date: 07/11/2023 CLINICAL DATA:  Right hip fracture. EXAM: PELVIS - 1-2 VIEW COMPARISON:  None Available. FINDINGS: Nondisplaced fracture of the greater trochanter of the right hip. No dislocation. Degenerative changes of the lower lumbar spine and mild arthritic changes of the hips. The soft tissues are unremarkable. IMPRESSION: Nondisplaced fracture of the greater trochanter of the right hip. Electronically Signed   By: Vanetta Chou M.D.   On: 07/11/2023 15:47   DG Chest 2 View Result Date: 07/11/2023 CLINICAL DATA:  Preoperative EXAM: CHEST - 2 VIEW COMPARISON:  Chest x-ray 08/02/2018 FINDINGS: The heart size and mediastinal contours are within normal limits. Both lungs are clear. The visualized skeletal structures are unremarkable. IMPRESSION: No active cardiopulmonary disease. Electronically Signed   By: Greig Pique M.D.   On: 07/11/2023 15:47    Positive ROS: All other systems have been reviewed and were otherwise negative with the exception of those mentioned in the HPI and as above.  Physical Exam: General: Alert, no acute distress Cardiovascular: No pedal edema Respiratory: No cyanosis, no use of accessory musculature GI: No organomegaly, abdomen is soft and non-tender Skin: No  lesions in the area of chief complaint Neurologic: Sensation intact distally Psychiatric: Patient is competent for consent with normal mood and affect Lymphatic: No axillary or cervical lymphadenopathy  MUSCULOSKELETAL:   Examination of the right hip reveals no skin wounds or lesions. No pain with palpation over trochanteric region, or gentle log rolling. Full ROM not performed due to known fracture. No shortening or ER noted.  Sensory and motor function intact in LE bilaterally. Distal pedal pulses 1+ on the right, 2+ on the left. No significant pedal edema. Compartments soft.   Assessment: Non-displaced right intertrochanteric femur fracture.   Plan: I discussed the findings with the patient. He has an unstable right hip fracture that will require surgical fixation for pain control and allow immediate mobilization out of  bed. [facility] has admitted for medical management and perioperative optimization, appreciate their consult. Plan for right IM fixation. He has a-fib with chronic Eliquis  use, last dose 07/11/23 in the morning. Plan for surgery likely Thursday to allow adequate Eliquis  washout. Continue to hold Eliquis . Hold lovenox Wednesday. NPO Wednesday MN. All questions solicited and answered.    Valery GORMAN Potters, PA-C    07/12/2023 8:20 AM

## 2023-07-13 DIAGNOSIS — S72001A Fracture of unspecified part of neck of right femur, initial encounter for closed fracture: Secondary | ICD-10-CM | POA: Diagnosis not present

## 2023-07-13 MED ORDER — MAGNESIUM HYDROXIDE 400 MG/5ML PO SUSP
15.0000 mL | Freq: Once | ORAL | Status: AC
  Administered 2023-07-13: 15 mL via ORAL
  Filled 2023-07-13: qty 30

## 2023-07-13 NOTE — TOC Initial Note (Signed)
 Transition of Care [facility]) - Initial/Assessment Note    Patient Details  Name: Jason Mcconnell MRN: [id number] Date of Birth: [birth year]  Transition of Care [facility]) CM/SW Contact:    NORMAN ASPEN, LCSW Phone Number: 07/13/2023, 2:07 PM  Clinical Narrative:                  Met with pt to introduce TOC/ CSW role with dc planning needs.  Pt very pleasant and awaiting surgery for hip fx planned for tomorrow.  Pt reports that he and his wife are completely independent with mobility and ADLs in their home.  They have one daughter living close by and she is able to provide support.  He is hopeful that he will be able to dc home after surgery and not require SNF rehab.  Explained that we will plan to follow up with him after surgery and PT evaluations are completed to begin addressing dc needs.  Expected Discharge Plan:  (TBD) Barriers to Discharge: Continued Medical Work up   Patient Goals and CMS Choice Patient states their goals for this hospitalization and ongoing recovery are:: pt hopeful to dc home following surgery          Expected Discharge Plan and Services In-house Referral: Clinical Social Work     Living arrangements for the past 2 months: Single Family Home                                      Prior Living Arrangements/Services Living arrangements for the past 2 months: Single Family Home Lives with:: Spouse Patient language and need for interpreter reviewed:: Yes Do you feel safe going back to the place where you live?: Yes      Need for Family Participation in Patient Care: Yes (Comment) Care giver support system in place?: Yes (comment)   Criminal Activity/Legal Involvement Pertinent to Current Situation/Hospitalization: No - Comment as needed  Activities of Daily Living   ADL Screening (condition at time of admission) Independently performs ADLs?: Yes (appropriate for developmental age) Is the patient deaf or have difficulty hearing?: Yes Does  the patient have difficulty seeing, even when wearing glasses/contacts?: Yes Does the patient have difficulty concentrating, remembering, or making decisions?: No  Permission Sought/Granted Permission sought to share information with : Family Supports Permission granted to share information with : Yes, Verbal Permission Granted  Share Information with NAME: spouse, [name]  @ [phone]           Emotional Assessment Appearance:: Appears stated age Attitude/Demeanor/Rapport: Gracious, Engaged Affect (typically observed): Accepting Orientation: : Oriented to Self, Oriented to Place, Oriented to  Time, Oriented to Situation Alcohol / Substance Use: Not Applicable Psych Involvement: No (comment)  Admission diagnosis:  Coagulopathy (HCC) [D68.9] Closed fracture of right hip, initial encounter (HCC) [S72.001A] Closed right hip fracture, initial encounter (HCC) [S72.001A] Patient Active Problem List   Diagnosis Date Noted   Closed right hip fracture, initial encounter (HCC) 07/11/2023   Leukopenia 09/06/2021   Wrist pain 08/21/2018   GERD (gastroesophageal reflux disease)    Coronary artery calcification seen on CT scan    Atrial fibrillation (HCC)    Back pain    Arthritis    Fracture of distal end of radius 11/12/2017   Ingrown nail 07/24/2015   Pain in soft tissues of limb 07/24/2015   OTHER MALAISE AND FATIGUE 09/13/2007   EPIDIDYMAL CYST 12/19/2006   PCP:  Keren,  Vicenta BRAVO, MD Pharmacy:   [facility] - [place], [place] - [address] [place] [address] Phone: [phone] Fax: [phone]     Social Drivers of Health (SDOH) Social History: SDOH Screenings   Food Insecurity: No Food Insecurity (07/11/2023)  Housing: Low Risk  (07/11/2023)  Transportation Needs: No Transportation Needs (07/11/2023)  Utilities: Not At Risk (07/11/2023)  Social Connections: Socially Integrated (07/11/2023)  Tobacco Use: Medium Risk (07/11/2023)    SDOH Interventions:     Readmission Risk Interventions    07/13/2023    2:03 PM  Readmission Risk Prevention Plan  Post Dischage Appt Complete  Medication Screening Complete  Transportation Screening Complete

## 2023-07-13 NOTE — Progress Notes (Signed)
 PROGRESS NOTE  Jason Mcconnell  FMW:[id number] DOB: Dec 18, 1930 DOA: 07/11/2023 PCP: Keren Vicenta BRAVO, MD   Brief Narrative: Patient is a 89 year old male with history of atrial fibrillation who presented for the management of right hip fracture.  He reported a fall on the stairs about a week ago and was seen in urgent care initially and there was concern for a fracture.  He was able to ambulate at home.  He was seen by [facility], and as per MRI done on the weekend, he was found to have nondisplaced comminuted right intertrochanteric femur fracture.  Was sent for admission to us  along hospital by orthopedic surgery for surgical intervention.  Plan for ORIF tomorrow  Assessment & Plan:  Principal Problem:   Closed right hip fracture, initial encounter (HCC)   Nondisplaced right intertrochanteric femur fracture: Continue pain management, supportive care, plan for ORIF tomorrow.  N.p.o. after midnight.  Orthopedics planning for right IM fixation.  Paroxysmal A-fib: Currently in sinus rhythm.  Eliquis  on hold for surgery.  Continue Cardizem   Pancytopenia: Was seen by oncology in the past.  Currently stable.  Has a stable pancytopenia.  We recommend to follow-up with oncology/hematology as an outpatient.        DVT prophylaxis:enoxaparin (LOVENOX) injection 40 mg Start: 07/11/23 1800 SCDs Start: 07/11/23 1356     Code Status: Full Code  Family Communication: None at bedside  Patient status:Inpatient  Patient is from :home  Anticipated discharge to:SNF  Estimated DC date:2-3 days   Consultants: Orthopedics  Procedures:Plan for ORiF  Antimicrobials:  Anti-infectives (From admission, onward)    None       Subjective: Patient seen and examined at bedside today.  Hemodynamically stable comfortable.  Lying in bed.  Denies any right hip pain today.  Waiting for surgery tomorrow   Objective: Vitals:   07/12/23 0934 07/12/23 1339 07/12/23 2242 07/13/23 0659   BP: 115/64 (!) 143/68 (!) 143/78 132/68  Pulse: 75 75 70 64  Resp: 17 17 15 16   Temp: 97.7 F (36.5 C) 97.7 F (36.5 C) 98 F (36.7 C) (!) 97.5 F (36.4 C)  TempSrc:   Oral Oral  SpO2: 100% 100% 95% 95%  Weight:      Height:        Intake/Output Summary (Last 24 hours) at 07/13/2023 1036 Last data filed at 07/13/2023 0600 Gross per 24 hour  Intake 840 ml  Output 800 ml  Net 40 ml   Filed Weights   07/11/23 1138  Weight: 68 kg    Examination:  General exam: Overall comfortable, not in distress, pleasant elderly male HEENT: PERRL Respiratory system:  no wheezes or crackles  Cardiovascular system: S1 & S2 heard, RRR.  Gastrointestinal system: Abdomen is nondistended, soft and nontender. Central nervous system: Alert and oriented Extremities: No edema, no clubbing ,no cyanosis, mild tenderness in the right hip Skin: No rashes, no ulcers,no icterus     Data Reviewed: I have personally reviewed following labs and imaging studies  CBC: Recent Labs  Lab 07/11/23 1328 07/12/23 0332  WBC 1.9* 1.9*  NEUTROABS 0.9*  --   HGB 10.0* 9.2*  HCT 31.6* 29.0*  MCV 102.9* 100.0  PLT 146* 148*   Basic Metabolic Panel: Recent Labs  Lab 07/11/23 1328 07/12/23 0332  NA 134* 139  K 4.2 4.2  CL 99 104  CO2 25 25  GLUCOSE 101* 106*  BUN 17 17  CREATININE 1.08 1.07  CALCIUM 8.9 9.1     No results  found for this or any previous visit (from the past 240 hours).   Radiology Studies: DG Pelvis 1-2 Views Result Date: 07/11/2023 CLINICAL DATA:  Right hip fracture. EXAM: PELVIS - 1-2 VIEW COMPARISON:  None Available. FINDINGS: Nondisplaced fracture of the greater trochanter of the right hip. No dislocation. Degenerative changes of the lower lumbar spine and mild arthritic changes of the hips. The soft tissues are unremarkable. IMPRESSION: Nondisplaced fracture of the greater trochanter of the right hip. Electronically Signed   By: Vanetta Chou M.D.   On: 07/11/2023 15:47    DG Chest 2 View Result Date: 07/11/2023 CLINICAL DATA:  Preoperative EXAM: CHEST - 2 VIEW COMPARISON:  Chest x-ray 08/02/2018 FINDINGS: The heart size and mediastinal contours are within normal limits. Both lungs are clear. The visualized skeletal structures are unremarkable. IMPRESSION: No active cardiopulmonary disease. Electronically Signed   By: Greig Pique M.D.   On: 07/11/2023 15:47    Scheduled Meds:  enoxaparin (LOVENOX) injection  40 mg Subcutaneous Q24H   Continuous Infusions:   LOS: 2 days   Ivonne Mustache, MD [facility] P6/25/2025, 10:36 AM

## 2023-07-13 NOTE — Progress Notes (Signed)
    Subjective:  Patient reports pain as mild.  Denies N/V/CP/SOB/Abd pain. Reports no pain at rest. Answered questions, he is eager for surgery.   Objective:   VITALS:   Vitals:   07/12/23 1339 07/12/23 2242 07/13/23 0659 07/13/23 1346  BP: (!) 143/68 (!) 143/78 132/68 139/75  Pulse: 75 70 64 69  Resp: 17 15 16 16   Temp: 97.7 F (36.5 C) 98 F (36.7 C) (!) 97.5 F (36.4 C) 98.2 F (36.8 C)  TempSrc:  Oral Oral   SpO2: 100% 95% 95% 99%  Weight:      Height:        NAD Neurologically intact ABD soft Neurovascular intact Sensation intact distally Intact pulses distally Dorsiflexion/Plantar flexion intact Incision: dressing C/D/I No cellulitis present Compartment soft   Lab Results  Component Value Date   WBC 1.9 (L) 07/12/2023   HGB 9.2 (L) 07/12/2023   HCT 29.0 (L) 07/12/2023   MCV 100.0 07/12/2023   PLT 148 (L) 07/12/2023   BMET    Component Value Date/Time   NA 139 07/12/2023 0332   NA 136 (A) 07/30/2021 0000   K 4.2 07/12/2023 0332   CL 104 07/12/2023 0332   CO2 25 07/12/2023 0332   GLUCOSE 106 (H) 07/12/2023 0332   BUN 17 07/12/2023 0332   BUN 17 07/30/2021 0000   CREATININE 1.07 07/12/2023 0332   CALCIUM 9.1 07/12/2023 0332   EGFR 58.0 06/09/2022 0000   EGFR 58 (L) 12/02/2020 0807   GFRNONAA >60 07/12/2023 9667     Assessment/Plan:     Principal Problem:   Closed right hip fracture, initial encounter (HCC)   NWB.  Hold chemical DVT ppx for surgery tomorrow NPO midnight tonight for plan for OR tomorrow.    Jason Mcconnell Potters 07/13/2023, 6:20 PM   [facility]  [facility] [address], [place], [place] [address] Phone: [phone] [email or web address] Facebook  [employer]

## 2023-07-13 NOTE — Plan of Care (Signed)

## 2023-07-14 ENCOUNTER — Encounter (HOSPITAL_COMMUNITY): Payer: Self-pay | Admitting: Internal Medicine

## 2023-07-14 ENCOUNTER — Encounter (HOSPITAL_COMMUNITY)
Admission: EM | Disposition: A | Discharge status: Home / Self Care | Payer: Self-pay | Source: Home / Self Care | Attending: Internal Medicine

## 2023-07-14 ENCOUNTER — Ambulatory Visit: Payer: Self-pay | Admitting: Student

## 2023-07-14 ENCOUNTER — Inpatient Hospital Stay (HOSPITAL_COMMUNITY)

## 2023-07-14 ENCOUNTER — Inpatient Hospital Stay (HOSPITAL_COMMUNITY): Admitting: Certified Registered Nurse Anesthetist

## 2023-07-14 ENCOUNTER — Other Ambulatory Visit: Payer: Self-pay

## 2023-07-14 DIAGNOSIS — I2584 Coronary atherosclerosis due to calcified coronary lesion: Secondary | ICD-10-CM | POA: Diagnosis not present

## 2023-07-14 DIAGNOSIS — I4891 Unspecified atrial fibrillation: Secondary | ICD-10-CM | POA: Diagnosis not present

## 2023-07-14 DIAGNOSIS — S72144A Nondisplaced intertrochanteric fracture of right femur, initial encounter for closed fracture: Secondary | ICD-10-CM

## 2023-07-14 DIAGNOSIS — Z87891 Personal history of nicotine dependence: Secondary | ICD-10-CM | POA: Diagnosis not present

## 2023-07-14 DIAGNOSIS — S72001A Fracture of unspecified part of neck of right femur, initial encounter for closed fracture: Secondary | ICD-10-CM

## 2023-07-14 HISTORY — PX: INTRAMEDULLARY (IM) NAIL INTERTROCHANTERIC: SHX5875

## 2023-07-14 LAB — CBC
HCT: 32.2 % — ABNORMAL LOW (ref 39.0–52.0)
Hemoglobin: 10.2 g/dL — ABNORMAL LOW (ref 13.0–17.0)
MCH: 31.7 pg (ref 26.0–34.0)
MCHC: 31.7 g/dL (ref 30.0–36.0)
MCV: 100 fL (ref 80.0–100.0)
Platelets: 167 10*3/uL (ref 150–400)
RBC: 3.22 MIL/uL — ABNORMAL LOW (ref 4.22–5.81)
RDW: 14.7 % (ref 11.5–15.5)
WBC: 1.8 10*3/uL — ABNORMAL LOW (ref 4.0–10.5)
nRBC: 0 % (ref 0.0–0.2)

## 2023-07-14 LAB — BASIC METABOLIC PANEL WITH GFR
Anion gap: 7 (ref 5–15)
BUN: 20 mg/dL (ref 8–23)
CO2: 27 mmol/L (ref 22–32)
Calcium: 9.2 mg/dL (ref 8.9–10.3)
Chloride: 102 mmol/L (ref 98–111)
Creatinine, Ser: 1.06 mg/dL (ref 0.61–1.24)
GFR, Estimated: >60 mL/min (ref >60)
Glucose, Bld: 115 mg/dL — ABNORMAL HIGH (ref 70–99)
Potassium: 4.7 mmol/L (ref 3.5–5.1)
Sodium: 136 mmol/L (ref 135–145)

## 2023-07-14 LAB — SURGICAL PCR SCREEN
MRSA, PCR: NEGATIVE
Staphylococcus aureus: NEGATIVE

## 2023-07-14 LAB — ABO/RH: ABO/RH(D): O POS

## 2023-07-14 LAB — TYPE AND SCREEN
ABO/RH(D): O POS
Antibody Screen: NEGATIVE

## 2023-07-14 SURGERY — FIXATION, FRACTURE, INTERTROCHANTERIC, WITH INTRAMEDULLARY ROD
Anesthesia: General | Laterality: Right

## 2023-07-14 MED ORDER — PROPOFOL 10 MG/ML IV BOLUS
INTRAVENOUS | Status: AC
  Filled 2023-07-14: qty 20

## 2023-07-14 MED ORDER — CEFAZOLIN SODIUM-DEXTROSE 2-4 GM/100ML-% IV SOLN
2.0000 g | Freq: Four times a day (QID) | INTRAVENOUS | Status: AC
  Administered 2023-07-14 – 2023-07-15 (×2): 2 g via INTRAVENOUS
  Filled 2023-07-14 (×2): qty 100

## 2023-07-14 MED ORDER — PHENYLEPHRINE HCL-NACL 20-0.9 MG/250ML-% IV SOLN
INTRAVENOUS | Status: DC | PRN
  Administered 2023-07-14: 30 ug/min via INTRAVENOUS

## 2023-07-14 MED ORDER — MENTHOL 3 MG MT LOZG: 1.0000 | LOZENGE | OROMUCOSAL | Status: DC | PRN

## 2023-07-14 MED ORDER — METOCLOPRAMIDE HCL 5 MG/ML IJ SOLN: 5.0000 mg | Freq: Three times a day (TID) | INTRAMUSCULAR | Status: DC | PRN

## 2023-07-14 MED ORDER — 0.9 % SODIUM CHLORIDE (POUR BTL) OPTIME
TOPICAL | Status: DC | PRN
  Administered 2023-07-14: 1000 mL

## 2023-07-14 MED ORDER — APIXABAN 2.5 MG PO TABS
2.5000 mg | ORAL_TABLET | Freq: Two times a day (BID) | ORAL | Status: DC
  Administered 2023-07-15: 2.5 mg via ORAL
  Filled 2023-07-14: qty 1

## 2023-07-14 MED ORDER — TRANEXAMIC ACID-NACL 1000-0.7 MG/100ML-% IV SOLN
INTRAVENOUS | Status: AC
  Filled 2023-07-14: qty 100

## 2023-07-14 MED ORDER — ONDANSETRON HCL 4 MG/2ML IJ SOLN: 4.0000 mg | Freq: Four times a day (QID) | INTRAMUSCULAR | Status: DC | PRN

## 2023-07-14 MED ORDER — ACETAMINOPHEN 10 MG/ML IV SOLN: 1000.0000 mg | Freq: Once | INTRAVENOUS | Status: DC | PRN

## 2023-07-14 MED ORDER — MORPHINE SULFATE (PF) 2 MG/ML IV SOLN: 0.5000 mg | INTRAVENOUS | Status: DC | PRN

## 2023-07-14 MED ORDER — LIDOCAINE HCL (PF) 2 % IJ SOLN
INTRAMUSCULAR | Status: AC
  Filled 2023-07-14: qty 5

## 2023-07-14 MED ORDER — ONDANSETRON HCL 4 MG/2ML IJ SOLN: 4.0000 mg | Freq: Once | INTRAMUSCULAR | Status: DC | PRN

## 2023-07-14 MED ORDER — ALUM & MAG HYDROXIDE-SIMETH 200-200-20 MG/5ML PO SUSP: 30.0000 mL | ORAL | Status: DC | PRN

## 2023-07-14 MED ORDER — ONDANSETRON HCL 4 MG/2ML IJ SOLN
INTRAMUSCULAR | Status: DC | PRN
  Administered 2023-07-14: 4 mg via INTRAVENOUS

## 2023-07-14 MED ORDER — FENTANYL CITRATE PF 50 MCG/ML IJ SOSY
25.0000 ug | PREFILLED_SYRINGE | INTRAMUSCULAR | Status: DC | PRN
  Administered 2023-07-14: 50 ug via INTRAVENOUS

## 2023-07-14 MED ORDER — ONDANSETRON HCL 4 MG PO TABS: 4.0000 mg | ORAL_TABLET | Freq: Four times a day (QID) | ORAL | Status: DC | PRN

## 2023-07-14 MED ORDER — METOCLOPRAMIDE HCL 5 MG PO TABS: 5.0000 mg | ORAL_TABLET | Freq: Three times a day (TID) | ORAL | Status: DC | PRN

## 2023-07-14 MED ORDER — HYDROCODONE-ACETAMINOPHEN 7.5-325 MG PO TABS: 1.0000 | ORAL_TABLET | ORAL | Status: DC | PRN

## 2023-07-14 MED ORDER — SUGAMMADEX SODIUM 200 MG/2ML IV SOLN
INTRAVENOUS | Status: DC | PRN
  Administered 2023-07-14: 200 mg via INTRAVENOUS

## 2023-07-14 MED ORDER — FENTANYL CITRATE (PF) 100 MCG/2ML IJ SOLN
INTRAMUSCULAR | Status: DC | PRN
  Administered 2023-07-14 (×2): 50 ug via INTRAVENOUS

## 2023-07-14 MED ORDER — CHLORHEXIDINE GLUCONATE 4 % EX SOLN: 60.0000 mL | Freq: Once | CUTANEOUS | Status: DC

## 2023-07-14 MED ORDER — ACETAMINOPHEN 500 MG PO TABS
1000.0000 mg | ORAL_TABLET | Freq: Once | ORAL | Status: AC
  Administered 2023-07-14: 1000 mg via ORAL

## 2023-07-14 MED ORDER — LIDOCAINE HCL (CARDIAC) PF 100 MG/5ML IV SOSY
PREFILLED_SYRINGE | INTRAVENOUS | Status: DC | PRN
  Administered 2023-07-14: 100 mg via INTRAVENOUS

## 2023-07-14 MED ORDER — FENTANYL CITRATE (PF) 100 MCG/2ML IJ SOLN
INTRAMUSCULAR | Status: AC
  Filled 2023-07-14: qty 2

## 2023-07-14 MED ORDER — BISACODYL 10 MG RE SUPP: 10.0000 mg | Freq: Every day | RECTAL | Status: DC | PRN

## 2023-07-14 MED ORDER — ROCURONIUM BROMIDE 10 MG/ML (PF) SYRINGE
PREFILLED_SYRINGE | INTRAVENOUS | Status: AC
  Filled 2023-07-14: qty 10

## 2023-07-14 MED ORDER — PHENYLEPHRINE HCL (PRESSORS) 10 MG/ML IV SOLN
INTRAVENOUS | Status: DC | PRN
Start: 2023-07-14 — End: 2023-07-14
  Administered 2023-07-14 (×2): 80 ug via INTRAVENOUS

## 2023-07-14 MED ORDER — METHOCARBAMOL 500 MG PO TABS
500.0000 mg | ORAL_TABLET | Freq: Four times a day (QID) | ORAL | Status: DC | PRN
  Administered 2023-07-14: 500 mg via ORAL
  Filled 2023-07-14: qty 1

## 2023-07-14 MED ORDER — POLYETHYLENE GLYCOL 3350 17 G PO PACK: 17.0000 g | PACK | Freq: Every day | ORAL | Status: DC | PRN

## 2023-07-14 MED ORDER — ISOPROPYL ALCOHOL 70 % SOLN
Status: DC | PRN
  Administered 2023-07-14: 1 via TOPICAL

## 2023-07-14 MED ORDER — TRANEXAMIC ACID-NACL 1000-0.7 MG/100ML-% IV SOLN
1000.0000 mg | INTRAVENOUS | Status: AC
  Administered 2023-07-14: 1000 mg via INTRAVENOUS

## 2023-07-14 MED ORDER — ROCURONIUM BROMIDE 100 MG/10ML IV SOLN
INTRAVENOUS | Status: DC | PRN
  Administered 2023-07-14: 50 mg via INTRAVENOUS

## 2023-07-14 MED ORDER — PROPOFOL 10 MG/ML IV BOLUS
INTRAVENOUS | Status: DC | PRN
Start: 2023-07-14 — End: 2023-07-14
  Administered 2023-07-14: 70 mg via INTRAVENOUS

## 2023-07-14 MED ORDER — DILTIAZEM HCL 30 MG PO TABS
30.0000 mg | ORAL_TABLET | Freq: Two times a day (BID) | ORAL | Status: DC
  Administered 2023-07-14 – 2023-07-15 (×2): 30 mg via ORAL
  Filled 2023-07-14 (×2): qty 1

## 2023-07-14 MED ORDER — METHOCARBAMOL 1000 MG/10ML IJ SOLN: 500.0000 mg | Freq: Four times a day (QID) | INTRAMUSCULAR | Status: DC | PRN

## 2023-07-14 MED ORDER — LACTATED RINGERS IV SOLN: INTRAVENOUS | Status: DC

## 2023-07-14 MED ORDER — CEFAZOLIN SODIUM-DEXTROSE 2-4 GM/100ML-% IV SOLN
INTRAVENOUS | Status: AC
  Filled 2023-07-14: qty 100

## 2023-07-14 MED ORDER — DEXAMETHASONE SODIUM PHOSPHATE 10 MG/ML IJ SOLN
INTRAMUSCULAR | Status: DC | PRN
Start: 2023-07-14 — End: 2023-07-14
  Administered 2023-07-14: 4 mg via INTRAVENOUS

## 2023-07-14 MED ORDER — PANTOPRAZOLE SODIUM 40 MG PO TBEC
40.0000 mg | DELAYED_RELEASE_TABLET | Freq: Every day | ORAL | Status: DC
  Administered 2023-07-15: 40 mg via ORAL
  Filled 2023-07-14: qty 1

## 2023-07-14 MED ORDER — OXYCODONE HCL 5 MG PO TABS: 5.0000 mg | ORAL_TABLET | Freq: Once | ORAL | Status: DC | PRN

## 2023-07-14 MED ORDER — PHENOL 1.4 % MT LIQD: 1.0000 | OROMUCOSAL | Status: DC | PRN

## 2023-07-14 MED ORDER — DOCUSATE SODIUM 100 MG PO CAPS
100.0000 mg | ORAL_CAPSULE | Freq: Two times a day (BID) | ORAL | Status: DC
  Administered 2023-07-14 – 2023-07-15 (×2): 100 mg via ORAL
  Filled 2023-07-14 (×2): qty 1

## 2023-07-14 MED ORDER — ACETAMINOPHEN 500 MG PO TABS
ORAL_TABLET | ORAL | Status: AC
  Filled 2023-07-14: qty 2

## 2023-07-14 MED ORDER — HYDROCODONE-ACETAMINOPHEN 5-325 MG PO TABS: 1.0000 | ORAL_TABLET | ORAL | Status: DC | PRN

## 2023-07-14 MED ORDER — OXYCODONE HCL 5 MG/5ML PO SOLN: 5.0000 mg | Freq: Once | ORAL | Status: DC | PRN

## 2023-07-14 MED ORDER — CEFAZOLIN SODIUM-DEXTROSE 2-4 GM/100ML-% IV SOLN
2.0000 g | INTRAVENOUS | Status: AC
  Administered 2023-07-14: 2 g via INTRAVENOUS

## 2023-07-14 MED ORDER — FENTANYL CITRATE PF 50 MCG/ML IJ SOSY
PREFILLED_SYRINGE | INTRAMUSCULAR | Status: AC
Start: 2023-07-14 — End: 2023-07-14
  Filled 2023-07-14: qty 1

## 2023-07-14 MED ORDER — STERILE WATER FOR IRRIGATION IR SOLN
Status: DC | PRN
  Administered 2023-07-14: 1000 mL

## 2023-07-14 MED ORDER — SENNA 8.6 MG PO TABS
1.0000 | ORAL_TABLET | Freq: Two times a day (BID) | ORAL | Status: DC
  Administered 2023-07-14 – 2023-07-15 (×2): 8.6 mg via ORAL
  Filled 2023-07-14 (×2): qty 1

## 2023-07-14 MED ORDER — POVIDONE-IODINE 10 % EX SWAB
2.0000 | Freq: Once | CUTANEOUS | Status: AC
  Administered 2023-07-14: 2 via TOPICAL

## 2023-07-14 SURGICAL SUPPLY — 39 items
BAG COUNTER SPONGE SURGICOUNT (BAG) IMPLANT
BAG ZIPLOCK 12X15 (MISCELLANEOUS) IMPLANT
BIT DRILL CANN LG 4.3MM (BIT) IMPLANT
CHLORAPREP W/TINT 26 (MISCELLANEOUS) ×1 IMPLANT
COVER PERINEAL POST (MISCELLANEOUS) ×1 IMPLANT
COVER SURGICAL LIGHT HANDLE (MISCELLANEOUS) ×1 IMPLANT
DERMABOND ADVANCED .7 DNX12 (GAUZE/BANDAGES/DRESSINGS) ×1 IMPLANT
DRAPE C-ARM 42X120 X-RAY (DRAPES) ×1 IMPLANT
DRAPE C-ARMOR (DRAPES) ×1 IMPLANT
DRAPE IMP U-DRAPE 54X76 (DRAPES) ×2 IMPLANT
DRAPE SHEET LG 3/4 BI-LAMINATE (DRAPES) ×3 IMPLANT
DRAPE STERI IOBAN 125X83 (DRAPES) ×1 IMPLANT
DRAPE U-SHAPE 47X51 STRL (DRAPES) ×2 IMPLANT
DRSG AQUACEL AG 3.5X4 (GAUZE/BANDAGES/DRESSINGS) ×1 IMPLANT
DRSG AQUACEL AG ADV 3.5X 4 (GAUZE/BANDAGES/DRESSINGS) IMPLANT
DRSG AQUACEL AG ADV 3.5X 6 (GAUZE/BANDAGES/DRESSINGS) ×1 IMPLANT
GAUZE SPONGE 4X4 12PLY STRL (GAUZE/BANDAGES/DRESSINGS) ×1 IMPLANT
GLOVE BIO SURGEON STRL SZ7 (GLOVE) ×1 IMPLANT
GLOVE BIO SURGEON STRL SZ8.5 (GLOVE) ×2 IMPLANT
GLOVE BIOGEL PI IND STRL 7.5 (GLOVE) ×1 IMPLANT
GLOVE BIOGEL PI IND STRL 8.5 (GLOVE) ×1 IMPLANT
GOWN SPEC L3 XXLG W/TWL (GOWN DISPOSABLE) ×1 IMPLANT
GOWN STRL REUS W/ TWL XL LVL3 (GOWN DISPOSABLE) ×1 IMPLANT
GUIDEPIN VERSANAIL DSP 3.2X444 (ORTHOPEDIC DISPOSABLE SUPPLIES) IMPLANT
HFN 125 DEG 11MM X 180MM (Orthopedic Implant) IMPLANT
HOOD PEEL AWAY T7 (MISCELLANEOUS) ×1 IMPLANT
KIT BASIN OR (CUSTOM PROCEDURE TRAY) ×1 IMPLANT
KIT TURNOVER KIT A (KITS) ×1 IMPLANT
MANIFOLD NEPTUNE II (INSTRUMENTS) ×1 IMPLANT
MARKER SKIN DUAL TIP RULER LAB (MISCELLANEOUS) ×1 IMPLANT
PACK GENERAL/GYN (CUSTOM PROCEDURE TRAY) ×1 IMPLANT
SCREW BONE CORTICAL 5.0X3 (Screw) IMPLANT
SCREW LAG 10.5MMX105MM HFN (Screw) IMPLANT
STAPLER SKIN PROX 35W (STAPLE) IMPLANT
SUT MNCRL AB 3-0 PS2 18 (SUTURE) ×1 IMPLANT
SUT MON AB 2-0 CT1 36 (SUTURE) ×1 IMPLANT
SUT VIC AB 1 CT1 36 (SUTURE) ×1 IMPLANT
TOWEL GREEN STERILE FF (TOWEL DISPOSABLE) ×1 IMPLANT
TOWEL OR 17X26 10 PK STRL BLUE (TOWEL DISPOSABLE) ×1 IMPLANT

## 2023-07-14 NOTE — Discharge Instructions (Signed)
 Dr. Brian Swinteck Adult Hip & Knee Specialist [facility] [address] [place], [place] [address] [phone]   POSTOPERATIVE DIRECTIONS    Hip Rehabilitation, Guidelines Following Surgery   WEIGHT BEARING Weight bearing as tolerated with assist device (walker, cane, etc) as directed, use it as long as suggested by your surgeon or therapist, typically at least 4-6 weeks.   HOME CARE INSTRUCTIONS  Remove items at home which could result in a fall. This includes throw rugs or furniture in walking pathways.  Continue medications as instructed at time of discharge.  You may have some home medications which will be placed on hold until you complete the course of blood thinner medication.  4 days after discharge, you may start showering. No tub baths or soaking your incisions. Do not put on socks or shoes without following the instructions of your caregivers.   Sit on chairs with arms. Use the chair arms to help push yourself up when arising.  Arrange for the use of a toilet seat elevator so you are not sitting low.   Walk with walker as instructed.  You may resume a sexual relationship in one month or when given the OK by your caregiver.  Use walker as long as suggested by your caregivers.  Avoid periods of inactivity such as sitting longer than an hour when not asleep. This helps prevent blood clots.  You may return to work once you are cleared by your surgeon.  Do not drive a car for 6 weeks or until released by your surgeon.  Do not drive while taking narcotics.  Wear elastic stockings for two weeks following surgery during the day but you may remove then at night.  Make sure you keep all of your appointments after your operation with all of your doctors and caregivers. You should call the office at the above phone number and make an appointment for approximately two weeks after the date of your surgery. Please pick up a stool softener and laxative  for home use as long as you are requiring pain medications.  ICE to the affected hip every three hours for 30 minutes at a time and then as needed for pain and swelling. Continue to use ice on the hip for pain and swelling from surgery. You may notice swelling that will progress down to the foot and ankle.  This is normal after surgery.  Elevate the leg when you are not up walking on it.   It is important for you to complete the blood thinner medication as prescribed by your doctor.  Continue to use the breathing machine which will help keep your temperature down.  It is common for your temperature to cycle up and down following surgery, especially at night when you are not up moving around and exerting yourself.  The breathing machine keeps your lungs expanded and your temperature down.  RANGE OF MOTION AND STRENGTHENING EXERCISES  These exercises are designed to help you keep full movement of your hip joint. Follow your caregiver's or physical therapist's instructions. Perform all exercises about fifteen times, three times per day or as directed. Exercise both hips, even if you have had only one joint replacement. These exercises can be done on a training (exercise) mat, on the floor, on a table or on a bed. Use whatever works the best and is most comfortable for you. Use music or television while you are exercising so that the exercises are a pleasant break in your day. This   will make your life better with the exercises acting as a break in routine you can look forward to.  Lying on your back, slowly slide your foot toward your buttocks, raising your knee up off the floor. Then slowly slide your foot back down until your leg is straight again.  Lying on your back spread your legs as far apart as you can without causing discomfort.  Lying on your side, raise your upper leg and foot straight up from the floor as far as is comfortable. Slowly lower the leg and repeat.  Lying on your back, tighten up the  muscle in the front of your thigh (quadriceps muscles). You can do this by keeping your leg straight and trying to raise your heel off the floor. This helps strengthen the largest muscle supporting your knee.  Lying on your back, tighten up the muscles of your buttocks both with the legs straight and with the knee bent at a comfortable angle while keeping your heel on the floor.   SKILLED REHAB INSTRUCTIONS: If the patient is transferred to a skilled rehab facility following release from the hospital, a list of the current medications will be sent to the facility for the patient to continue.  When discharged from the skilled rehab facility, please have the facility set up the patient's Home Health Physical Therapy prior to being released. Also, the skilled facility will be responsible for providing the patient with their medications at time of release from the facility to include their pain medication and their blood thinner medication. If the patient is still at the rehab facility at time of the two week follow up appointment, the skilled rehab facility will also need to assist the patient in arranging follow up appointment in our office and any transportation needs.  MAKE SURE YOU:  Understand these instructions.  Will watch your condition.  Will get help right away if you are not doing well or get worse.  Pick up stool softner and laxative for home use following surgery while on pain medications. Daily dry dressing changes as needed. In 4 days, you may remove your dressings and begin taking showers - no tub baths or soaking the incisions. Continue to use ice for pain and swelling after surgery. Do not use any lotions or creams on the incision until instructed by your surgeon.   

## 2023-07-14 NOTE — Anesthesia Procedure Notes (Signed)
 Procedure Name: Intubation Date/Time: 07/14/2023 3:11 PM  Performed by: Lanning Cena RAMAN, CRNAPre-anesthesia Checklist: Patient identified, Emergency Drugs available, Suction available, Patient being monitored and Timeout performed Patient Re-evaluated:Patient Re-evaluated prior to induction Oxygen Delivery Method: Circle system utilized Preoxygenation: Pre-oxygenation with 100% oxygen Induction Type: IV induction Ventilation: Mask ventilation without difficulty Laryngoscope Size: Miller and 2 Grade View: Grade II Tube type: Oral Tube size: 7.0 mm Number of attempts: 1 Airway Equipment and Method: Stylet Placement Confirmation: ETT inserted through vocal cords under direct vision, positive ETCO2, CO2 detector and breath sounds checked- equal and bilateral Secured at: 21 cm Tube secured with: Tape Dental Injury: Teeth and Oropharynx as per pre-operative assessment

## 2023-07-14 NOTE — Interval H&P Note (Signed)
 History and Physical Interval Note:  07/14/2023 1:05 PM  Jason Mcconnell  has presented today for surgery, with the diagnosis of right hip fracture.  The various methods of treatment have been discussed with the patient and family. After consideration of risks, benefits and other options for treatment, the patient has consented to  Procedure(s): FIXATION, FRACTURE, INTERTROCHANTERIC, WITH INTRAMEDULLARY ROD (Right) as a surgical intervention.  The patient's history has been reviewed, patient examined, no change in status, stable for surgery.  I have reviewed the patient's chart and labs.  Questions were answered to the patient's satisfaction.     Jason Mcconnell

## 2023-07-14 NOTE — Plan of Care (Signed)
 ?  Problem: Education: ?Goal: Knowledge of General Education information will improve ?Description: Including pain rating scale, medication(s)/side effects and non-pharmacologic comfort measures ?Outcome: Progressing ?  ?Problem: Health Behavior/Discharge Planning: ?Goal: Ability to manage health-related needs will improve ?Outcome: Progressing ?  ?Problem: Coping: ?Goal: Level of anxiety will decrease ?Outcome: Progressing ?  ?

## 2023-07-14 NOTE — Op Note (Signed)
 OPERATIVE REPORT  SURGEON: Redell Shoals, MD   ASSISTANT: Valery Potters, PA-C.  PREOPERATIVE DIAGNOSIS: Right intertrochanteric femur fracture.   POSTOPERATIVE DIAGNOSIS: Right intertrochanteric femur fracture.   PROCEDURE: Intramedullary fixation, Right femur.   IMPLANTS: Biomet Affixus Hip Fracture Nail, 11 by 180 mm, 125 degrees. 10.5 x 105 mm Hip Fracture Nail Lag Screw. 5 x 34 mm distal interlocking screw 1.  ANESTHESIA:  Spinal  ESTIMATED BLOOD LOSS: 50 mL  ANTIBIOTICS: 2 g Ancef.  DRAINS: None.  COMPLICATIONS: None.   CONDITION: PACU - hemodynamically stable.SABRA   BRIEF CLINICAL NOTE: Jason Mcconnell is a 89 y.o. male who presented with an intertrochanteric femur fracture. The patient was admitted to the hospitalist service and underwent perioperative risk stratification and medical optimization. The risks, benefits, and alternatives to the procedure were explained, and the patient elected to proceed.  PROCEDURE IN DETAIL: Surgical site was marked by myself. The patient was taken to the operating room and anesthesia was induced on the bed. The patient was then transferred to the [facility] table and the nonoperative lower extremity was scissored underneath the operative side. The fracture was reduced with traction, internal rotation, and adduction. The hip was prepped and draped in the normal sterile surgical fashion. Timeout was called verifying side and site of surgery. Preop antibiotics were given with 60 minutes of beginning the procedure.  Fluoroscopy was used to define the patient's anatomy. A 4 cm incision was made just proximal to the tip of the greater trochanter. The awl was used to obtain the standard starting point for a trochanteric entry nail under fluoroscopic control. The guidepin was placed. The entry reamer was used to open the proximal femur.  On the back table, the nail was assembled onto the jig. The nail was placed into the femur without any difficulty.  Through a separate stab incision, the cannula was placed down to the bone in preparation for the cephalomedullary device. A guidepin was placed into the femoral head using AP and lateral fluoroscopy views. The pin was measured, and then reaming was performed to the appropriate depth. The lag screw was inserted to the appropriate depth. The fracture was compressed through the jig. The setscrew was tightened and then loosened one quarter turn. A separate stab incision was created, and the distal interlocking screw was placed using standard AO technique. The jig was removed. Final AP and lateral fluoroscopy views were obtained to confirm fracture reduction and hardware placement. Tip apex distance was appropriate. There was no chondral penetration.  The wounds were copiously irrigated with saline. The wound was closed in layers with #1 Vicryl for the fascia, 2-0 Monocryl for the deep dermal layer, and staples + Dermabond for the skin. Once the glue was fully hardened, sterile dressing was applied. The patient was then awakened from anesthesia and taken to the PACU in stable condition. Sponge needle and instrument counts were correct at the end of the case 2. There were no known complications.  We will readmit the patient to the hospitalist. Weightbearing status will be weightbearing as tolerated with a walker. We will resume apixaban  for DVT prophylaxis. The patient will work with physical therapy and undergo disposition planning.  Please note that a surgical assistant was a medical necessity for this procedure to perform it in a safe and expeditious manner. Assistant was necessary to provide appropriate retraction of vital neurovascular structures, to prevent femoral fracture, and to allow for anatomic placement of the prosthesis.

## 2023-07-14 NOTE — Anesthesia Preprocedure Evaluation (Addendum)
 Anesthesia Evaluation  Patient identified by MRN, date of birth, ID band Patient awake    Reviewed: Allergy & Precautions, NPO status , Patient's Chart, lab work & pertinent test results, reviewed documented beta blocker date and time   History of Anesthesia Complications Negative for: history of anesthetic complications  Airway Mallampati: I  TM Distance: >3 FB     Dental  (+) Upper Dentures, Lower Dentures   Pulmonary neg COPD, Patient abstained from smoking., former smoker   breath sounds clear to auscultation       Cardiovascular (-) angina + CAD  (-) Past MI and (-) Cardiac Stents + dysrhythmias Atrial Fibrillation  Rhythm:Regular Rate:Normal     Neuro/Psych neg Seizures TIA   GI/Hepatic ,GERD  ,,  Endo/Other    Renal/GU      Musculoskeletal  (+) Arthritis ,    Abdominal   Peds  Hematology  (+) Blood dyscrasia (chronic pancytopenia), anemia   Anesthesia Other Findings   Reproductive/Obstetrics                              Anesthesia Physical Anesthesia Plan  ASA: 3  Anesthesia Plan: General   Post-op Pain Management:    Induction: Intravenous  PONV Risk Score and Plan: 2 and Ondansetron and Dexamethasone  Airway Management Planned: LMA  Additional Equipment:   Intra-op Plan:   Post-operative Plan:   Informed Consent: I have reviewed the patients History and Physical, chart, labs and discussed the procedure including the risks, benefits and alternatives for the proposed anesthesia with the patient or authorized representative who has indicated his/her understanding and acceptance.     Dental advisory given  Plan Discussed with: CRNA  Anesthesia Plan Comments:          Anesthesia Quick Evaluation

## 2023-07-14 NOTE — Transfer of Care (Signed)
 Immediate Anesthesia Transfer of Care Note  Patient: Jason Mcconnell  Procedure(s) Performed: FIXATION, FRACTURE, INTERTROCHANTERIC, WITH INTRAMEDULLARY ROD (Right)  Patient Location: PACU  Anesthesia Type:General  Level of Consciousness: sedated  Airway & Oxygen Therapy: Patient Spontanous Breathing and Patient connected to face mask oxygen  Post-op Assessment: Report given to RN and Post -op Vital signs reviewed and stable  Post vital signs: Reviewed and stable  Last Vitals:  Vitals Value Taken Time  BP 129/67 07/14/23 16:26  Temp    Pulse 58 07/14/23 16:28  Resp 6 07/14/23 16:28  SpO2 100 % 07/14/23 16:28  Vitals shown include unfiled device data.  Last Pain:  Vitals:   07/14/23 1327  TempSrc:   PainSc: 0-No pain         Complications: No notable events documented.

## 2023-07-14 NOTE — Progress Notes (Signed)
 PROGRESS NOTE  Jason Mcconnell  FMW:[id number] DOB: 1931-01-05 DOA: 07/11/2023 PCP: Keren Vicenta BRAVO, MD   Brief Narrative: Patient is a 89 year old male with history of atrial fibrillation who presented for the management of right hip fracture.  He reported a fall on the stairs about a week ago and was seen in urgent care initially and there was concern for a fracture.  He was able to ambulate at home.  He was seen by [facility], and as per MRI done on the weekend, he was found to have nondisplaced comminuted right intertrochanteric femur fracture.  Was sent for admission to [facility] by orthopedic surgery for surgical intervention.  Plan for ORIF today  Assessment & Plan:  Principal Problem:   Closed right hip fracture, initial encounter (HCC)   Nondisplaced right intertrochanteric femur fracture: Continue pain management, supportive care, plan for ORIF tomorrow.  N.p.o. after midnight.  Orthopedics planning for right IM fixation. Patient does not complain of any pain on the right hip  Paroxysmal A-fib: Currently in sinus rhythm.  Eliquis  on hold for surgery.  Continue Cardizem   Pancytopenia: Was seen by oncology in the past.  Currently stable.  Has a stable pancytopenia.  We recommend to follow-up with oncology/hematology as an outpatient.        DVT prophylaxis:SCDs Start: 07/11/23 1356     Code Status: Full Code  Family Communication: None at bedside  Patient status:Inpatient  Patient is from :home  Anticipated discharge to:SNF  Estimated DC date:2-3 days   Consultants: Orthopedics  Procedures:Plan for ORiF  Antimicrobials:  Anti-infectives (From admission, onward)    None       Subjective: Patient seen and examined at bedside today.  Very comfortable.  Lying on bed.  Reading a book.  Does not feel comfortable with right hip   Objective: Vitals:   07/13/23 1346 07/13/23 2049 07/14/23 0636 07/14/23 1029  BP: 139/75 129/69 118/63  (!) 145/69  Pulse: 69 66 68 64  Resp: 16 18 17 17   Temp: 98.2 F (36.8 C) 98.5 F (36.9 C) 97.6 F (36.4 C) 98 F (36.7 C)  TempSrc:  Oral Oral   SpO2: 99% 99% 97% 99%  Weight:      Height:        Intake/Output Summary (Last 24 hours) at 07/14/2023 1115 Last data filed at 07/14/2023 9176 Gross per 24 hour  Intake 600 ml  Output 1025 ml  Net -425 ml   Filed Weights   07/11/23 1138  Weight: 68 kg    Examination:   General exam: Overall comfortable, not in distress, pleasant elderly male HEENT: PERRL Respiratory system:  no wheezes or crackles  Cardiovascular system: S1 & S2 heard, RRR.  Gastrointestinal system: Abdomen is nondistended, soft and nontender. Central nervous system: Alert and oriented Extremities: No edema, no clubbing ,no cyanosis, mild tenderness on the right hip Skin: No rashes, no ulcers,no icterus       Data Reviewed: I have personally reviewed following labs and imaging studies  CBC: Recent Labs  Lab 07/11/23 1328 07/12/23 0332 07/14/23 0953  WBC 1.9* 1.9* 1.8*  NEUTROABS 0.9*  --   --   HGB 10.0* 9.2* 10.2*  HCT 31.6* 29.0* 32.2*  MCV 102.9* 100.0 100.0  PLT 146* 148* 167   Basic Metabolic Panel: Recent Labs  Lab 07/11/23 1328 07/12/23 0332 07/14/23 0953  NA 134* 139 136  K 4.2 4.2 4.7  CL 99 104 102  CO2 25 25 27   GLUCOSE 101*  106* 115*  BUN 17 17 20   CREATININE 1.08 1.07 1.06  CALCIUM 8.9 9.1 9.2     Recent Results (from the past 240 hours)  Surgical pcr screen     Status: None   Collection Time: 07/14/23  6:37 AM   Specimen: Nasal Mucosa; Nasal Swab  Result Value Ref Range Status   MRSA, PCR NEGATIVE NEGATIVE Final   Staphylococcus aureus NEGATIVE NEGATIVE Final    Comment: (NOTE) The Xpert SA Assay (FDA approved for NASAL specimens in patients 23 years of age and older), is one component of a comprehensive surveillance program. It is not intended to diagnose infection nor to guide or monitor treatment. Performed at  [facility], [address], [place], [place] [address]      Radiology Studies: No results found.   Scheduled Meds:   Continuous Infusions:   LOS: 3 days   Ivonne Mustache, MD [facility] P6/26/2025, 11:15 AM

## 2023-07-15 ENCOUNTER — Other Ambulatory Visit: Payer: Self-pay | Admitting: Cardiology

## 2023-07-15 ENCOUNTER — Other Ambulatory Visit (HOSPITAL_COMMUNITY): Payer: Self-pay

## 2023-07-15 ENCOUNTER — Encounter (HOSPITAL_COMMUNITY): Payer: Self-pay | Admitting: Orthopedic Surgery

## 2023-07-15 DIAGNOSIS — S72001A Fracture of unspecified part of neck of right femur, initial encounter for closed fracture: Secondary | ICD-10-CM | POA: Diagnosis not present

## 2023-07-15 LAB — BASIC METABOLIC PANEL WITH GFR
Anion gap: 8 (ref 5–15)
BUN: 23 mg/dL (ref 8–23)
CO2: 26 mmol/L (ref 22–32)
Calcium: 8.9 mg/dL (ref 8.9–10.3)
Chloride: 100 mmol/L (ref 98–111)
Creatinine, Ser: 1.15 mg/dL (ref 0.61–1.24)
GFR, Estimated: >60 mL/min (ref >60)
Glucose, Bld: 162 mg/dL — ABNORMAL HIGH (ref 70–99)
Potassium: 4.6 mmol/L (ref 3.5–5.1)
Sodium: 134 mmol/L — ABNORMAL LOW (ref 135–145)

## 2023-07-15 LAB — CBC
HCT: 30.9 % — ABNORMAL LOW (ref 39.0–52.0)
Hemoglobin: 9.7 g/dL — ABNORMAL LOW (ref 13.0–17.0)
MCH: 32.1 pg (ref 26.0–34.0)
MCHC: 31.4 g/dL (ref 30.0–36.0)
MCV: 102.3 fL — ABNORMAL HIGH (ref 80.0–100.0)
Platelets: 157 10*3/uL (ref 150–400)
RBC: 3.02 MIL/uL — ABNORMAL LOW (ref 4.22–5.81)
RDW: 14.2 % (ref 11.5–15.5)
WBC: 1.7 10*3/uL — ABNORMAL LOW (ref 4.0–10.5)
nRBC: 0 % (ref 0.0–0.2)

## 2023-07-15 MED ORDER — DOCUSATE SODIUM 100 MG PO CAPS
100.0000 mg | ORAL_CAPSULE | Freq: Two times a day (BID) | ORAL | 0 refills | Status: DC
  Filled 2023-07-15: qty 60, 30d supply, fill #0

## 2023-07-15 MED ORDER — POLYETHYLENE GLYCOL 3350 17 GM/SCOOP PO POWD
17.0000 g | Freq: Every day | ORAL | 0 refills | Status: DC | PRN
  Filled 2023-07-15: qty 238, 14d supply, fill #0

## 2023-07-15 MED ORDER — HYDROCODONE-ACETAMINOPHEN 5-325 MG PO TABS
1.0000 | ORAL_TABLET | ORAL | 0 refills | Status: AC | PRN
  Filled 2023-07-15: qty 40, 7d supply, fill #0

## 2023-07-15 MED ORDER — SENNA 8.6 MG PO TABS
1.0000 | ORAL_TABLET | Freq: Two times a day (BID) | ORAL | 0 refills | Status: AC
  Filled 2023-07-15: qty 14, 7d supply, fill #0

## 2023-07-15 NOTE — Progress Notes (Signed)
 Physical Therapy Treatment Patient Details Name: Jason Mcconnell MRN: [id number] DOB: 01/14/1931 Today's Date: 07/15/2023   History of Present Illness Patient is a 89 year old male with history of atrial fibrillation who presented for the management of right hip fracture.  He reported a fall on the stairs about a week ago and was seen in urgent care initially and there was concern for a fracture.  He was able to ambulate at home.  He was seen by [facility], and as per MRI done on the weekend, he was found to have nondisplaced comminuted right intertrochanteric femur fracture and sent for admission (07/11/23) to [facility] by orthopedic surgery for surgical intervention. Pt now  status post Right femur Intramedullary fixation on 07/14/23.    PT Comments  Pt educated on and performed LE exercises in supine, sitting and standing.  Pt provided with HEP handout and had no further questions.  Pt requested to ambulate prior to return to rest so ambulated short distance in hallway (short to conserve energy for pending d/c home today).  Pt ready to d/c from PT standpoint (RN notified).    If plan is discharge home, recommend the following: Assistance with cooking/housework;Assist for transportation;Help with stairs or ramp for entrance   Can travel by private vehicle        Equipment Recommendations  None recommended by PT    Recommendations for Other Services       Precautions / Restrictions Precautions Precautions: Fall Restrictions RLE Weight Bearing Per Provider Order: Weight bearing as tolerated     Mobility  Bed Mobility Overal bed mobility: Modified Independent Bed Mobility: Supine to Sit     Supine to sit: Supervision     General bed mobility comments: HOB elevated, no physical assist required    Transfers Overall transfer level: Needs assistance Equipment used: Rolling walker (2 wheels) Transfers: Sit to/from Stand Sit to Stand: Contact guard assist,  Supervision           General transfer comment: CGA for safety; pt utilizing hands to self assist without cues    Ambulation/Gait Ambulation/Gait assistance: Contact guard assist, Supervision Gait Distance (Feet): 60 Feet Assistive device: Rolling walker (2 wheels) Gait Pattern/deviations: Step-to pattern, Antalgic, Decreased stance time - right       General Gait Details: verbal cue for sequence and RW positioning; utilizing RW well; reports feeling more sore and to d/c home soon so limited distance (by therapist)   Stairs    Wheelchair Mobility     Tilt Bed    Modified Rankin (Stroke Patients Only)       Balance Overall balance assessment:  (admitted due to fall on step leading to fx)                                          Communication Communication Communication: Impaired Factors Affecting Communication: Hearing impaired  Cognition Arousal: Alert Behavior During Therapy: WFL for tasks assessed/performed   PT - Cognitive impairments: No apparent impairments                         Following commands: Intact      Cueing    Exercises Total Joint Exercises Ankle Circles/Pumps: AROM, Both, 10 reps, Supine Quad Sets: AROM, Both, 10 reps, Supine Short Arc Quad: AROM, Right, 10 reps, Supine Heel Slides: AAROM, Right, 10 reps, Supine Hip  ABduction/ADduction: AROM, Standing, Supine, 10 reps, Right Long Arc Quad: AROM, Right, Seated, 10 reps Knee Flexion: AROM, Right, Standing, 10 reps Marching in Standing: AROM, Standing, Right, 10 reps Standing Hip Extension: AROM, Right, Standing, 10 reps    General Comments        Pertinent Vitals/Pain Pain Assessment Pain Assessment: 0-10 Pain Score: 3  Pain Location: right hip Pain Descriptors / Indicators: Sore, Aching Pain Intervention(s): Repositioned, Monitored during session    Home Living Family/patient expects to be discharged to:: Private residence Living Arrangements:  Spouse/significant other Available Help at Discharge: Family;Available 24 hours/day Type of Home: House Home Access: Stairs to enter Entrance Stairs-Rails: None Entrance Stairs-Number of Steps: 1   Home Layout: One level Home Equipment: Agricultural consultant (2 wheels);Cane - single point      Prior Function            PT Goals (current goals can now be found in the care plan section) Acute Rehab PT Goals PT Goal Formulation: With patient Time For Goal Achievement: 07/18/23 Potential to Achieve Goals: Good Progress towards PT goals: Progressing toward goals    Frequency    7X/week      PT Plan      Co-evaluation              AM-PAC PT 6 Clicks Mobility   Outcome Measure  Help needed turning from your back to your side while in a flat bed without using bedrails?: A Little Help needed moving from lying on your back to sitting on the side of a flat bed without using bedrails?: A Little Help needed moving to and from a bed to a chair (including a wheelchair)?: A Little Help needed standing up from a chair using your arms (e.g., wheelchair or bedside chair)?: A Little Help needed to walk in hospital room?: A Little Help needed climbing 3-5 steps with a railing? : A Little 6 Click Score: 18    End of Session Equipment Utilized During Treatment: Gait belt Activity Tolerance: Patient tolerated treatment well Patient left: with call bell/phone within reach;in bed;with bed alarm set Nurse Communication: Mobility status PT Visit Diagnosis: Other abnormalities of gait and mobility (R26.89)     Time: 8848-8794 PT Time Calculation (min) (ACUTE ONLY): 14 min  Charges:     $Therapeutic Exercise: 8-22 mins PT General Charges $$ ACUTE PT VISIT: 1 Visit                     Tari KLEIN, DPT Physical Therapist Acute Rehabilitation Services Office: [phone]    Kati L Payson 07/15/2023, 1:15 PM

## 2023-07-15 NOTE — Plan of Care (Signed)
  Problem: Education: Goal: Knowledge of General Education information will improve Description: Including pain rating scale, medication(s)/side effects and non-pharmacologic comfort measures 07/15/2023 1326 by Deette Thedora LABOR, LPN Outcome: Adequate for Discharge 07/15/2023 0951 by Deette Thedora LABOR, LPN Outcome: Progressing   Problem: Health Behavior/Discharge Planning: Goal: Ability to manage health-related needs will improve Outcome: Adequate for Discharge   Problem: Clinical Measurements: Goal: Ability to maintain clinical measurements within normal limits will improve Outcome: Adequate for Discharge Goal: Will remain free from infection Outcome: Adequate for Discharge Goal: Diagnostic test results will improve Outcome: Adequate for Discharge Goal: Respiratory complications will improve Outcome: Adequate for Discharge Goal: Cardiovascular complication will be avoided 07/15/2023 1326 by Deette Thedora LABOR, LPN Outcome: Adequate for Discharge 07/15/2023 0951 by Deette Thedora LABOR, LPN Outcome: Progressing   Problem: Activity: Goal: Risk for activity intolerance will decrease 07/15/2023 1326 by Deette Thedora A, LPN Outcome: Adequate for Discharge 07/15/2023 0951 by Deette Thedora LABOR, LPN Outcome: Progressing   Problem: Nutrition: Goal: Adequate nutrition will be maintained Outcome: Adequate for Discharge   Problem: Coping: Goal: Level of anxiety will decrease Outcome: Adequate for Discharge   Problem: Elimination: Goal: Will not experience complications related to bowel motility Outcome: Adequate for Discharge Goal: Will not experience complications related to urinary retention Outcome: Adequate for Discharge   Problem: Pain Managment: Goal: General experience of comfort will improve and/or be controlled 07/15/2023 1326 by Deette Thedora A, LPN Outcome: Adequate for Discharge 07/15/2023 0951 by Deette Thedora LABOR, LPN Outcome: Progressing   Problem: Safety: Goal: Ability to  remain free from injury will improve Outcome: Adequate for Discharge   Problem: Skin Integrity: Goal: Risk for impaired skin integrity will decrease Outcome: Adequate for Discharge   Problem: Education: Goal: Verbalization of understanding the information provided (i.e., activity precautions, restrictions, etc) will improve Outcome: Adequate for Discharge Goal: Individualized Educational Video(s) Outcome: Adequate for Discharge   Problem: Activity: Goal: Ability to ambulate and perform ADLs will improve Outcome: Adequate for Discharge   Problem: Clinical Measurements: Goal: Postoperative complications will be avoided or minimized Outcome: Adequate for Discharge   Problem: Self-Concept: Goal: Ability to maintain and perform role responsibilities to the fullest extent possible will improve Outcome: Adequate for Discharge   Problem: Pain Management: Goal: Pain level will decrease Outcome: Adequate for Discharge

## 2023-07-15 NOTE — TOC Transition Note (Signed)
 Transition of Care [facility]) - Discharge Note   Patient Details  Name: Jason Mcconnell MRN: [id number] Date of Birth: [birth year]  [facility] CM/SW Contact:  Sheri ONEIDA Sharps, LCSW Phone Number: 07/15/2023, 1:27 PM   Clinical Narrative:    Pt medically ready to dc home. Therapy plane HEP. Pt has RW at home. No further TOC needs.   Final next level of care: Home/Self Care Barriers to Discharge: Barriers Resolved   Patient Goals and CMS Choice Patient states their goals for this hospitalization and ongoing recovery are:: return home   Choice offered to / list presented to : NA      Discharge Placement                    Patient and family notified of of transfer: 07/15/23  Discharge Plan and Services Additional resources added to the After Visit Summary for   In-house Referral: Clinical Social Work              DME Arranged: N/A DME Agency: NA       HH Arranged: NA HH Agency: NA        Social Drivers of Health (SDOH) Interventions SDOH Screenings   Food Insecurity: No Food Insecurity (07/11/2023)  Housing: Low Risk  (07/11/2023)  Transportation Needs: No Transportation Needs (07/11/2023)  Utilities: Not At Risk (07/11/2023)  Social Connections: Socially Integrated (07/11/2023)  Tobacco Use: Medium Risk (07/14/2023)     Readmission Risk Interventions    07/13/2023    2:03 PM  Readmission Risk Prevention Plan  Post Dischage Appt Complete  Medication Screening Complete  Transportation Screening Complete

## 2023-07-15 NOTE — Discharge Summary (Signed)
 Physician Discharge Summary  Jason Mcconnell FMW:[id number] DOB: [birth year] DOA: 07/11/2023  PCP: Keren Vicenta BRAVO, MD  Admit date: 07/11/2023 Discharge date: 07/15/2023  Admitted From: Home Disposition:  Home  Discharge Condition:Stable CODE STATUS:FULL Diet recommendation: Heart Healthy   Brief/Interim Summary: Patient is a 89 year old male with history of atrial fibrillation who presented for the management of right hip fracture.  He reported a fall on the stairs about a week ago and was seen in urgent care initially and there was concern for a fracture.  He was able to ambulate at home.  He was seen by [facility], and as per MRI done on the weekend, he was found to have nondisplaced comminuted right intertrochanteric femur fracture.  Was sent for admission to [facility] by orthopedic surgery for surgical intervention.  Status post intramedullary fixation on 6/26.  He did great postoperatively.  Physical therapy recommend outpatient PT.  He is very eager to go home.  Medically stable for discharge.  He will follow-up with orthopedics in 2 weeks  Following problems were addressed during the hospitalization:  Nondisplaced right intertrochanteric femur fracture: Patient does not complain of any pain on the right hip today.Status post intramedullary fixation on 6/26.  Physical therapy recommend outpatient PT.    Paroxysmal A-fib: Currently in sinus rhythm. On eliquis .  Continue Cardizem    Pancytopenia: Was seen by oncology in the past.  Currently stable.  Has a stable pancytopenia.  We recommend to follow-up with oncology/hematology as an outpatient.   Discharge Diagnoses:  Principal Problem:   Closed right hip fracture, initial encounter [facility])    Discharge Instructions  Discharge Instructions     Diet - low sodium heart healthy   Complete by: As directed    Discharge instructions   Complete by: As directed    1)Please take your medications as  instructed. 2)Follow up with orthopedic surgery in 2 weeks for x-rays and wound check 3)Follow up with outpatient physical therapy   Increase activity slowly   Complete by: As directed    No wound care   Complete by: As directed       Allergies as of 07/15/2023   No Known Allergies      Medication List     TAKE these medications    apixaban  5 MG Tabs tablet Commonly known as: ELIQUIS  Take 1 tablet (5 mg total) by mouth 2 (two) times daily.   Benadryl Allergy 25 MG tablet Generic drug: diphenhydrAMINE Take 25-50 mg by mouth every 6 (six) hours as needed for itching or allergies.   diltiazem  30 MG tablet Commonly known as: CARDIZEM  Take 1 tablet (30 mg total) by mouth 2 (two) times daily. What changed:  when to take this reasons to take this   docusate sodium  100 MG capsule Commonly known as: COLACE Take 1 capsule (100 mg total) by mouth 2 (two) times daily.   HYDROcodone -acetaminophen  5-325 MG tablet Commonly known as: NORCO/VICODIN Take 1 tablet by mouth every 4 (four) hours as needed for up to 7 days for moderate pain (pain score 4-6) or severe pain (pain score 7-10).   magnesium  hydroxide 400 MG/5ML suspension Commonly known as: MILK OF MAGNESIA Take 30 mLs by mouth daily as needed for mild constipation.   polyethylene glycol 17 g packet Commonly known as: MIRALAX  / GLYCOLAX  Take 17 g by mouth daily as needed for mild constipation.   senna 8.6 MG Tabs tablet Commonly known as: SENOKOT Take 1 tablet (8.6 mg total) by mouth 2 (  two) times daily for 7 days.        Follow-up Information     Leigh Valery RAMAN, [place]. Schedule an appointment as soon as possible for a visit in 2 week(s).   Specialty: Orthopedic Surgery Why: For suture removal, For wound re-check Contact information: [address] [place] [address] [phone]                No Known Allergies  Consultations: Orthopedics   Procedures/Studies: DG Pelvis  Portable Result Date: 07/14/2023 CLINICAL DATA:  Fracture, postop. EXAM: PORTABLE PELVIS 1-2 VIEWS COMPARISON:  Preoperative imaging FINDINGS: Femoral intramedullary nail with trans trochanteric and distal locking screw fixation traverse proximal femur fracture. Fracture is not well visualized on provided view. Recent postsurgical change includes air and edema in the soft tissues. Overlying skin staples in place. IMPRESSION: ORIF of proximal femur fracture. Electronically Signed   By: Andrea Gasman M.D.   On: 07/14/2023 17:46   DG HIP UNILAT WITH PELVIS 1V RIGHT Result Date: 07/14/2023 CLINICAL DATA:  Elective surgery. EXAM: DG HIP (WITH OR WITHOUT PELVIS) 1V RIGHT COMPARISON:  Preoperative imaging. FINDINGS: Nine fluoroscopic spot views of the pelvis and right hip obtained in the operating room. Sequential images during femoral intramedullary nail with trans trochanteric and distal locking screw fixation traversing proximal femur fracture. Fluoroscopy time 29 seconds. Dose 3.95 mGy. IMPRESSION: Intraoperative fluoroscopy during proximal femur fracture fixation. Electronically Signed   By: Andrea Gasman M.D.   On: 07/14/2023 17:45   DG C-Arm 1-60 Min-No Report Result Date: 07/14/2023 Fluoroscopy was utilized by the requesting physician.  No radiographic interpretation.   DG Pelvis 1-2 Views Result Date: 07/11/2023 CLINICAL DATA:  Right hip fracture. EXAM: PELVIS - 1-2 VIEW COMPARISON:  None Available. FINDINGS: Nondisplaced fracture of the greater trochanter of the right hip. No dislocation. Degenerative changes of the lower lumbar spine and mild arthritic changes of the hips. The soft tissues are unremarkable. IMPRESSION: Nondisplaced fracture of the greater trochanter of the right hip. Electronically Signed   By: Vanetta Chou M.D.   On: 07/11/2023 15:47   DG Chest 2 View Result Date: 07/11/2023 CLINICAL DATA:  Preoperative EXAM: CHEST - 2 VIEW COMPARISON:  Chest x-ray 08/02/2018 FINDINGS:  The heart size and mediastinal contours are within normal limits. Both lungs are clear. The visualized skeletal structures are unremarkable. IMPRESSION: No active cardiopulmonary disease. Electronically Signed   By: Greig Pique M.D.   On: 07/11/2023 15:47   DG Hip Unilat With Pelvis 2-3 Views Right Result Date: 07/05/2023 CLINICAL DATA:  Status post fall EXAM: DG HIP (WITH OR WITHOUT PELVIS) 3V RIGHT COMPARISON:  CT abdomen and pelvis dated 06/17/2023 FINDINGS: Cortical discontinuity involving the right greater trochanter. Fracture lucency likely extends into the medial femoral neck. No acute dislocation. Partially imaged degenerative changes of the lower lumbar spine. Degenerative changes of the bilateral hips. Left lower quadrant linear radiodensities may be postsurgical. IMPRESSION: Nondisplaced fracture of the right greater trochanter with likely extension into the medial femoral neck. Electronically Signed   By: Limin  Xu M.D.   On: 07/05/2023 14:21      Subjective: Patient seen and examined at bedside today.  He was working with physical therapy.  He is very eager to go home.  Denies any pain on the right hip.  Hemodynamically stable for discharge.  Discharge Exam: Vitals:   07/15/23 0529 07/15/23 1002  BP: 132/64 (!) 109/95  Pulse: 74 70  Resp: 16 16  Temp: 98 F (  36.7 C) 98.1 F (36.7 C)  SpO2: 99% 100%   Vitals:   07/14/23 2128 07/15/23 0155 07/15/23 0529 07/15/23 1002  BP: (!) 140/68 123/67 132/64 (!) 109/95  Pulse:  67 74 70  Resp:  16 16 16   Temp:  98 F (36.7 C) 98 F (36.7 C) 98.1 F (36.7 C)  TempSrc:  Oral Oral Oral  SpO2:  100% 99% 100%  Weight:      Height:        General: Pt is alert, awake, not in acute distress Cardiovascular: RRR, S1/S2 +, no rubs, no gallops Respiratory: CTA bilaterally, no wheezing, no rhonchi Abdominal: Soft, NT, ND, bowel sounds + Extremities: no edema, no cyanosis, surgical wound on the right hip    The results of significant  diagnostics from this hospitalization (including imaging, microbiology, ancillary and laboratory) are listed below for reference.     Microbiology: Recent Results (from the past 240 hours)  Surgical pcr screen     Status: None   Collection Time: 07/14/23  6:37 AM   Specimen: Nasal Mucosa; Nasal Swab  Result Value Ref Range Status   MRSA, PCR NEGATIVE NEGATIVE Final   Staphylococcus aureus NEGATIVE NEGATIVE Final    Comment: (NOTE) The Xpert SA Assay (FDA approved for NASAL specimens in patients 75 years of age and older), is one component of a comprehensive surveillance program. It is not intended to diagnose infection nor to guide or monitor treatment. Performed at [name], [address], [place], [place] [address]      Labs: BNP (last 3 results) No results for input(s): BNP in the last 8760 hours. Basic Metabolic Panel: Recent Labs  Lab 07/11/23 1328 07/12/23 0332 07/14/23 0953 07/15/23 0322  NA 134* 139 136 134*  K 4.2 4.2 4.7 4.6  CL 99 104 102 100  CO2 25 25 27 26   GLUCOSE 101* 106* 115* 162*  BUN 17 17 20 23   CREATININE 1.08 1.07 1.06 1.15  CALCIUM 8.9 9.1 9.2 8.9   Liver Function Tests: Recent Labs  Lab 07/11/23 1328  AST 17  ALT 10  ALKPHOS 84  BILITOT 0.8  PROT 6.4*  ALBUMIN 3.5   No results for input(s): LIPASE, AMYLASE in the last 168 hours. No results for input(s): AMMONIA in the last 168 hours. CBC: Recent Labs  Lab 07/11/23 1328 07/12/23 0332 07/14/23 0953 07/15/23 0322  WBC 1.9* 1.9* 1.8* 1.7*  NEUTROABS 0.9*  --   --   --   HGB 10.0* 9.2* 10.2* 9.7*  HCT 31.6* 29.0* 32.2* 30.9*  MCV 102.9* 100.0 100.0 102.3*  PLT 146* 148* 167 157   Cardiac Enzymes: No results for input(s): CKTOTAL, CKMB, CKMBINDEX, TROPONINI in the last 168 hours. BNP: Invalid input(s): POCBNP CBG: No results for input(s): GLUCAP in the last 168 hours. D-Dimer No results for input(s): DDIMER in the last 72  hours. Hgb A1c No results for input(s): HGBA1C in the last 72 hours. Lipid Profile No results for input(s): CHOL, HDL, LDLCALC, TRIG, CHOLHDL, LDLDIRECT in the last 72 hours. Thyroid  function studies No results for input(s): TSH, T4TOTAL, T3FREE, THYROIDAB in the last 72 hours.  Invalid input(s): FREET3 Anemia work up No results for input(s): VITAMINB12, FOLATE, FERRITIN, TIBC, IRON, RETICCTPCT in the last 72 hours. Urinalysis No results found for: COLORURINE, APPEARANCEUR, LABSPEC, PHURINE, GLUCOSEU, HGBUR, BILIRUBINUR, KETONESUR, PROTEINUR, UROBILINOGEN, NITRITE, LEUKOCYTESUR Sepsis Labs Recent Labs  Lab 07/11/23 1328 07/12/23 0332 07/14/23 0953 07/15/23 0322  WBC 1.9* 1.9* 1.8*  1.7*   Microbiology Recent Results (from the past 240 hours)  Surgical pcr screen     Status: None   Collection Time: 07/14/23  6:37 AM   Specimen: Nasal Mucosa; Nasal Swab  Result Value Ref Range Status   MRSA, PCR NEGATIVE NEGATIVE Final   Staphylococcus aureus NEGATIVE NEGATIVE Final    Comment: (NOTE) The Xpert SA Assay (FDA approved for NASAL specimens in patients 63 years of age and older), is one component of a comprehensive surveillance program. It is not intended to diagnose infection nor to guide or monitor treatment. Performed at [facility], [address], [place], [place] [address]     Please note: You were cared for by a hospitalist during your hospital stay. Once you are discharged, your primary care physician will handle any further medical issues. Please note that NO REFILLS for any discharge medications will be authorized once you are discharged, as it is imperative that you return to your primary care physician (or establish a relationship with a primary care physician if you do not have one) for your post hospital discharge needs so that they can reassess your need for medications and monitor your  lab values.    Time coordinating discharge: 40 minutes  SIGNED:   Ivonne Mustache, MD  [facility] 07/15/2023, 10:49 AM Pager [phone]  If 7PM-7AM, please contact night-coverage [email or web address] Password [email or web address]

## 2023-07-15 NOTE — Plan of Care (Signed)
  Problem: Education: Goal: Knowledge of General Education information will improve Description: Including pain rating scale, medication(s)/side effects and non-pharmacologic comfort measures Outcome: Progressing   Problem: Clinical Measurements: Goal: Cardiovascular complication will be avoided Outcome: Progressing   Problem: Activity: Goal: Risk for activity intolerance will decrease Outcome: Progressing   Problem: Pain Managment: Goal: General experience of comfort will improve and/or be controlled Outcome: Progressing

## 2023-07-15 NOTE — Progress Notes (Signed)
    Subjective:  Patient reports pain as mild to none.  Denies N/V/CP/SOB/Abd pain. He denies any tingling or numbness in LE bilaterally. He is doing well. He is very eager for d/c home today.   Objective:   VITALS:   Vitals:   07/14/23 2005 07/14/23 2128 07/15/23 0155 07/15/23 0529  BP: (!) 140/68 (!) 140/68 123/67 132/64  Pulse: 78  67 74  Resp: 16  16 16   Temp: 97.7 F (36.5 C)  98 F (36.7 C) 98 F (36.7 C)  TempSrc: Oral  Oral Oral  SpO2: 98%  100% 99%  Weight:      Height:        NAD Neurologically intact ABD soft Neurovascular intact Sensation intact distally Intact pulses distally Dorsiflexion/Plantar flexion intact Incision: dressing C/D/I No cellulitis present Compartment soft   Lab Results  Component Value Date   WBC 1.7 (L) 07/15/2023   HGB 9.7 (L) 07/15/2023   HCT 30.9 (L) 07/15/2023   MCV 102.3 (H) 07/15/2023   PLT 157 07/15/2023   BMET    Component Value Date/Time   NA 134 (L) 07/15/2023 0322   NA 136 (A) 07/30/2021 0000   K 4.6 07/15/2023 0322   CL 100 07/15/2023 0322   CO2 26 07/15/2023 0322   GLUCOSE 162 (H) 07/15/2023 0322   BUN 23 07/15/2023 0322   BUN 17 07/30/2021 0000   CREATININE 1.15 07/15/2023 0322   CALCIUM 8.9 07/15/2023 0322   EGFR 58.0 06/09/2022 0000   EGFR 58 (L) 12/02/2020 0807   GFRNONAA >60 07/15/2023 0322     Assessment/Plan: 1 Day Post-Op   Principal Problem:   Closed right hip fracture, initial encounter (HCC)   WBAT with walker DVT ppx: Eliquis , SCDs, TEDS PO pain control PT/OT: to come today.  Dispo:  - Patient under care of the medical team, disposition per their recommendation. Patient hopeful for d/c home. Pain medications sent to [facility] pharmacy. Resume home Eliquis  at d/c.   Valery GORMAN Potters 07/15/2023, 7:56 AM   [facility]  [facility] [address], [place], [place] [address] Phone: [phone] [email or web address] Facebook  [employer]

## 2023-07-15 NOTE — Evaluation (Signed)
 Physical Therapy Evaluation Patient Details Name: Jason Mcconnell MRN: [id number] DOB: [birth year] Today's Date: 07/15/2023  History of Present Illness  Patient is a 89 year old male with history of atrial fibrillation who presented for the management of right hip fracture.  He reported a fall on the stairs about a week ago and was seen in urgent care initially and there was concern for a fracture.  He was able to ambulate at home.  He was seen by [facility], and as per MRI done on the weekend, he was found to have nondisplaced comminuted right intertrochanteric femur fracture and sent for admission (07/11/23) to [facility] by orthopedic surgery for surgical intervention. Pt now  status post Right femur Intramedullary fixation on 07/14/23.  Clinical Impression  Patient is s/p above surgery resulting in functional limitations due to the deficits listed below (see PT Problem List).  Patient will benefit from acute skilled PT to increase their independence and safety with mobility to facilitate discharge.  Pt requesting to get dressed upon PT entering room.  Pt provided with his clothes and cues for safely dressing at EOB.  Pt able to ambulate in hallway and practiced one step to enter home.  Pt typically very active and lives with his spouse who can assist as needed upon d/c (also reports support available from daughter).  Due to fall prior to admission and pt now s/p surgery, pt would benefit from OPPT.  Pt states he already has RW and SPC at home.  Pt eager to d/c home today.  *Notified MD of pt being ready for d/c from PT standpoint and Ortho requested pt d/c with initial HEP until f/u appointment.  Plan to return to review HEP with pt prior to d/c home today (RN aware).         If plan is discharge home, recommend the following: Assistance with cooking/housework;Assist for transportation;Help with stairs or ramp for entrance   Can travel by private vehicle        Equipment  Recommendations None recommended by PT  Recommendations for Other Services       Functional Status Assessment Patient has had a recent decline in their functional status and demonstrates the ability to make significant improvements in function in a reasonable and predictable amount of time.     Precautions / Restrictions Precautions Precautions: Fall Restrictions RLE Weight Bearing Per Provider Order: Weight bearing as tolerated      Mobility  Bed Mobility Overal bed mobility: Needs Assistance Bed Mobility: Supine to Sit     Supine to sit: Supervision          Transfers Overall transfer level: Needs assistance Equipment used: Rolling walker (2 wheels) Transfers: Sit to/from Stand Sit to Stand: Contact guard assist           General transfer comment: CGA for safety; pt utilizing hands to self assist without cues; only requiring cues for keeping RW for safety    Ambulation/Gait Ambulation/Gait assistance: Contact guard assist Gait Distance (Feet): 240 Feet Assistive device: Rolling walker (2 wheels) Gait Pattern/deviations: Step-to pattern, Antalgic, Decreased stance time - right       General Gait Details: verbal cue for sequence and RW positioning; utilizing RW well  Stairs Stairs: Yes Stairs assistance: Contact guard assist Stair Management: Step to pattern, Forwards, With walker Number of Stairs: 1 General stair comments: verbal cues for safety, sequence, technique; pt declined performing second time  Wheelchair Mobility     Tilt Bed    Modified  Rankin (Stroke Patients Only)       Balance Overall balance assessment:  (admitted due to fall on step leading to fx)                                           Pertinent Vitals/Pain Pain Assessment Pain Assessment: 0-10 Pain Score: 3  Pain Location: right hip Pain Descriptors / Indicators: Sore, Aching Pain Intervention(s): Monitored during session, Repositioned, Ice applied     Home Living Family/patient expects to be discharged to:: Private residence Living Arrangements: Spouse/significant other Available Help at Discharge: Family;Available 24 hours/day Type of Home: House Home Access: Stairs to enter Entrance Stairs-Rails: None Entrance Stairs-Number of Steps: 1   Home Layout: One level Home Equipment: Agricultural consultant (2 wheels);Cane - single point      Prior Function Prior Level of Function : Independent/Modified Independent                     Extremity/Trunk Assessment        Lower Extremity Assessment Lower Extremity Assessment: RLE deficits/detail RLE Deficits / Details: anticipated post op hip weakness observed however pt able to perform R LE movement against gravity, grossly at least 3-/5, able to perform ankle pumps       Communication   Communication Communication: Impaired Factors Affecting Communication: Hearing impaired    Cognition Arousal: Alert Behavior During Therapy: WFL for tasks assessed/performed   PT - Cognitive impairments: No apparent impairments                         Following commands: Intact       Cueing       General Comments      Exercises     Assessment/Plan    PT Assessment Patient needs continued PT services  PT Problem List Decreased strength;Decreased mobility;Decreased balance;Decreased knowledge of use of DME       PT Treatment Interventions DME instruction;Gait training;Balance training;Functional mobility training;Therapeutic activities;Therapeutic exercise;Stair training;Patient/family education    PT Goals (Current goals can be found in the Care Plan section)  Acute Rehab PT Goals PT Goal Formulation: With patient Time For Goal Achievement: 07/18/23 Potential to Achieve Goals: Good    Frequency 7X/week     Co-evaluation               AM-PAC PT 6 Clicks Mobility  Outcome Measure Help needed turning from your back to your side while in a flat bed  without using bedrails?: A Little Help needed moving from lying on your back to sitting on the side of a flat bed without using bedrails?: A Little Help needed moving to and from a bed to a chair (including a wheelchair)?: A Little Help needed standing up from a chair using your arms (e.g., wheelchair or bedside chair)?: A Little Help needed to walk in hospital room?: A Little Help needed climbing 3-5 steps with a railing? : A Little 6 Click Score: 18    End of Session Equipment Utilized During Treatment: Gait belt Activity Tolerance: Patient tolerated treatment well Patient left: in chair;with call bell/phone within reach;with chair alarm set Nurse Communication: Mobility status PT Visit Diagnosis: Other abnormalities of gait and mobility (R26.89)    Time: 9070-9046 PT Time Calculation (min) (ACUTE ONLY): 24 min   Charges:   PT Evaluation $PT Eval Low Complexity: 1  Low PT Treatments $Gait Training: 8-22 mins PT General Charges $$ ACUTE PT VISIT: 1 Visit       Tari PT, DPT Physical Therapist Acute Rehabilitation Services Office: [phone]   Tari CROME Payson 07/15/2023, 12:52 PM

## 2023-07-15 NOTE — Telephone Encounter (Signed)
 Prescription refill request for Eliquis  received. Indication:AFIB Last office visit:2/25 Scr:1.15  6/25 Age: 89 Weight:68  kg  Prescription refilled

## 2023-07-21 NOTE — Anesthesia Postprocedure Evaluation (Signed)
 Anesthesia Post Note  Patient: Jason Mcconnell  Procedure(s) Performed: FIXATION, FRACTURE, INTERTROCHANTERIC, WITH INTRAMEDULLARY ROD (Right)     Patient location during evaluation: PACU Anesthesia Type: General Level of consciousness: awake and alert Pain management: pain level controlled Vital Signs Assessment: post-procedure vital signs reviewed and stable Respiratory status: spontaneous breathing, nonlabored ventilation, respiratory function stable and patient connected to nasal cannula oxygen Cardiovascular status: blood pressure returned to baseline and stable Postop Assessment: no apparent nausea or vomiting Anesthetic complications: no   No notable events documented.  Last Vitals:  Vitals:   07/15/23 0529 07/15/23 1002  BP: 132/64 (!) 109/95  Pulse: 74 70  Resp: 16 16  Temp: 36.7 C 36.7 C  SpO2: 99% 100%    Last Pain:  Vitals:   07/15/23 1002  TempSrc: Oral  PainSc:                  Lynwood MARLA Cornea

## 2023-07-25 DIAGNOSIS — M25551 Pain in right hip: Secondary | ICD-10-CM | POA: Diagnosis not present

## 2023-07-25 DIAGNOSIS — S72001A Fracture of unspecified part of neck of right femur, initial encounter for closed fracture: Secondary | ICD-10-CM | POA: Diagnosis not present

## 2023-07-25 DIAGNOSIS — I480 Paroxysmal atrial fibrillation: Secondary | ICD-10-CM | POA: Diagnosis not present

## 2023-07-29 DIAGNOSIS — L821 Other seborrheic keratosis: Secondary | ICD-10-CM | POA: Diagnosis not present

## 2023-07-29 DIAGNOSIS — L299 Pruritus, unspecified: Secondary | ICD-10-CM | POA: Diagnosis not present

## 2023-07-29 DIAGNOSIS — L209 Atopic dermatitis, unspecified: Secondary | ICD-10-CM | POA: Diagnosis not present

## 2023-08-01 DIAGNOSIS — S72144D Nondisplaced intertrochanteric fracture of right femur, subsequent encounter for closed fracture with routine healing: Secondary | ICD-10-CM | POA: Diagnosis not present

## 2023-08-04 NOTE — Progress Notes (Unsigned)
 [facility]  [address] [place],  [place]  [address] [phone]  Clinic Day:  08/05/2023  Referring physician: Keren Vicenta BRAVO, MD   HISTORY OF PRESENT ILLNESS:  The patient is a 89 y.o. male with both mild leukopenia and anemia.  Previous labs have shown mild pancytopenia.  However, as his counts have been stable, he has been followed conservatively.  He comes in today to reassess his peripheral counts.  Since his last visit, the patient has run into a few issues.  He fell a few weeks ago, which led to a right hip fracture.  His wife claims that he is also weaker and does not eat as much as he previously did.  The patient denies having any overt forms of blood loss, but admits to being more fatigued.  PHYSICAL EXAM:  Blood pressure 131/73, pulse 67, temperature 97.8 F (36.6 C), temperature source Oral, resp. rate 16, height 6' (1.829 m), weight 147 lb 12.8 oz (67 kg), SpO2 100%. Wt Readings from Last 3 Encounters:  08/05/23 147 lb 12.8 oz (67 kg)  07/14/23 149 lb 14.6 oz (68 kg)  03/16/23 155 lb 3.2 oz (70.4 kg)   Body mass index is 20.05 kg/m. Performance status (ECOG): 1 - Symptomatic but completely ambulatory Physical Exam Constitutional:      Appearance: Normal appearance. He is not ill-appearing.     Comments: A pleasant older gentleman who looks weaker versus previous visits.  He is ambulating with a cane  HENT:     Mouth/Throat:     Mouth: Mucous membranes are moist.     Pharynx: Oropharynx is clear. No oropharyngeal exudate or posterior oropharyngeal erythema.  Cardiovascular:     Rate and Rhythm: Normal rate and regular rhythm.     Heart sounds: No murmur heard.    No friction rub. No gallop.  Pulmonary:     Effort: Pulmonary effort is normal. No respiratory distress.     Breath sounds: Normal breath sounds. No wheezing, rhonchi or rales.  Abdominal:     General: Bowel sounds are normal. There is no distension.      Palpations: Abdomen is soft. There is no mass.     Tenderness: There is no abdominal tenderness.  Musculoskeletal:        General: No swelling.     Right lower leg: No edema.     Left lower leg: No edema.  Lymphadenopathy:     Cervical: No cervical adenopathy.     Upper Body:     Right upper body: No supraclavicular or axillary adenopathy.     Left upper body: No supraclavicular or axillary adenopathy.     Lower Body: No right inguinal adenopathy. No left inguinal adenopathy.  Skin:    General: Skin is warm.     Coloration: Skin is not jaundiced.     Findings: No lesion or rash.  Neurological:     General: No focal deficit present.     Mental Status: He is alert and oriented to person, place, and time. Mental status is at baseline.  Psychiatric:        Mood and Affect: Mood normal.        Behavior: Behavior normal.        Thought Content: Thought content normal.    LABS:      Latest Ref Rng & Units 08/05/2023    9:23 AM 07/15/2023    3:22 AM 07/14/2023    9:53 AM  CBC  WBC 4.0 - 10.5 K/uL 2.3  1.7  1.8   Hemoglobin 13.0 - 17.0 g/dL 9.4  9.7  89.7   Hematocrit 39.0 - 52.0 % 29.6  30.9  32.2   Platelets 150 - 400 K/uL 109  157  167       Latest Ref Rng & Units 08/05/2023    9:23 AM 07/15/2023    3:22 AM 07/14/2023    9:53 AM  CMP  Glucose 70 - 99 mg/dL 873  837  884   BUN 8 - 23 mg/dL 25  23  20    Creatinine 0.61 - 1.24 mg/dL 8.45  8.84  8.93   Sodium 135 - 145 mmol/L 139  134  136   Potassium 3.5 - 5.1 mmol/L 4.2  4.6  4.7   Chloride 98 - 111 mmol/L 104  100  102   CO2 22 - 32 mmol/L 25  26  27    Calcium 8.9 - 10.3 mg/dL 9.3  8.9  9.2   Total Protein 6.5 - 8.1 g/dL 6.5     Total Bilirubin 0.0 - 1.2 mg/dL 0.5     Alkaline Phos 38 - 126 U/L 144     AST 15 - 41 U/L 19     ALT 0 - 44 U/L 6       Latest Reference Range & Units 08/05/23 09:23  Iron 45 - 182 ug/dL 77  UIBC ug/dL 795  TIBC 749 - 549 ug/dL 718  Saturation Ratios 17.9 - 39.5 % 27  Ferritin 24 - 336 ng/mL  92  Folate >5.9 ng/mL 15.3  Vitamin B12 180 - 914 pg/mL 190    ASSESSMENT & PLAN:  Assessment/Plan:  A 89 y.o. male whose labs today clearly show pancytopenia.  All 3 blood cell lines are lower today than when I last saw him in January 2025.  His labs today show borderline low B12 levels.  Based upon this, we will arrange for him to receive weekly B12 shots x 2 more weeks.  After that, I will consider giving him a B12 shot monthly. I will see him back in 2 months for repeat clinical assessment.  If his pancytopenia does not improve over time, a bone marrow biopsy will be done to ensure an intrinsic marrow disorder, such as myelodysplasia, is not present.  The patient understands all the plans discussed today and is in agreement with them.     Jason Mcconnell DELENA Kerns, MD

## 2023-08-05 ENCOUNTER — Other Ambulatory Visit: Payer: Self-pay | Admitting: Oncology

## 2023-08-05 ENCOUNTER — Other Ambulatory Visit: Payer: Self-pay

## 2023-08-05 ENCOUNTER — Inpatient Hospital Stay: Attending: Oncology

## 2023-08-05 ENCOUNTER — Inpatient Hospital Stay: Admitting: Oncology

## 2023-08-05 VITALS — BP 131/73 | HR 67 | Temp 97.8°F | Resp 16 | Ht 72.0 in | Wt 147.8 lb

## 2023-08-05 DIAGNOSIS — D61818 Other pancytopenia: Secondary | ICD-10-CM | POA: Insufficient documentation

## 2023-08-05 DIAGNOSIS — E538 Deficiency of other specified B group vitamins: Secondary | ICD-10-CM | POA: Insufficient documentation

## 2023-08-05 DIAGNOSIS — D649 Anemia, unspecified: Secondary | ICD-10-CM | POA: Diagnosis not present

## 2023-08-05 DIAGNOSIS — D72819 Decreased white blood cell count, unspecified: Secondary | ICD-10-CM

## 2023-08-05 LAB — CMP (CANCER CENTER ONLY)
ALT: 6 U/L (ref 0–44)
AST: 19 U/L (ref 15–41)
Albumin: 3.6 g/dL (ref 3.5–5.0)
Alkaline Phosphatase: 144 U/L — ABNORMAL HIGH (ref 38–126)
Anion gap: 10 (ref 5–15)
BUN: 25 mg/dL — ABNORMAL HIGH (ref 8–23)
CO2: 25 mmol/L (ref 22–32)
Calcium: 9.3 mg/dL (ref 8.9–10.3)
Chloride: 104 mmol/L (ref 98–111)
Creatinine: 1.54 mg/dL — ABNORMAL HIGH (ref 0.61–1.24)
GFR, Estimated: 43 mL/min — ABNORMAL LOW (ref >60)
Glucose, Bld: 126 mg/dL — ABNORMAL HIGH (ref 70–99)
Potassium: 4.2 mmol/L (ref 3.5–5.1)
Sodium: 139 mmol/L (ref 135–145)
Total Bilirubin: 0.5 mg/dL (ref 0.0–1.2)
Total Protein: 6.5 g/dL (ref 6.5–8.1)

## 2023-08-05 LAB — IRON AND TIBC
Iron: 77 ug/dL (ref 45–182)
Saturation Ratios: 27 % (ref 17.9–39.5)
TIBC: 281 ug/dL (ref 250–450)
UIBC: 204 ug/dL

## 2023-08-05 LAB — CBC WITH DIFFERENTIAL (CANCER CENTER ONLY)
Abs Immature Granulocytes: 0.03 K/uL (ref 0.00–0.07)
Basophils Absolute: 0 K/uL (ref 0.0–0.1)
Basophils Relative: 0 %
Eosinophils Absolute: 0 K/uL (ref 0.0–0.5)
Eosinophils Relative: 0 %
HCT: 29.6 % — ABNORMAL LOW (ref 39.0–52.0)
Hemoglobin: 9.4 g/dL — ABNORMAL LOW (ref 13.0–17.0)
Immature Granulocytes: 1 %
Immature Platelet Fraction: 4.7 % (ref 1.2–8.6)
Lymphocytes Relative: 42 %
Lymphs Abs: 1 K/uL (ref 0.7–4.0)
MCH: 31.8 pg (ref 26.0–34.0)
MCHC: 31.8 g/dL (ref 30.0–36.0)
MCV: 100 fL (ref 80.0–100.0)
Monocytes Absolute: 0.3 K/uL (ref 0.1–1.0)
Monocytes Relative: 11 %
Neutro Abs: 1 K/uL — ABNORMAL LOW (ref 1.7–7.7)
Neutrophils Relative %: 46 %
Platelet Count: 109 K/uL — ABNORMAL LOW (ref 150–400)
RBC: 2.96 MIL/uL — ABNORMAL LOW (ref 4.22–5.81)
RDW: 15 % (ref 11.5–15.5)
WBC Count: 2.3 K/uL — ABNORMAL LOW (ref 4.0–10.5)
nRBC: 0 % (ref 0.0–0.2)

## 2023-08-05 LAB — FERRITIN: Ferritin: 92 ng/mL (ref 24–336)

## 2023-08-05 LAB — FOLATE: Folate: 15.3 ng/mL (ref >5.9)

## 2023-08-05 LAB — VITAMIN B12: Vitamin B-12: 190 pg/mL (ref 180–914)

## 2023-08-05 MED ORDER — CYANOCOBALAMIN 1000 MCG/ML IJ SOLN: INTRAMUSCULAR | 2 refills | Status: DC

## 2023-08-15 ENCOUNTER — Other Ambulatory Visit: Payer: Medicare Other

## 2023-08-15 ENCOUNTER — Ambulatory Visit: Payer: Medicare Other | Admitting: Oncology

## 2023-08-22 ENCOUNTER — Other Ambulatory Visit (HOSPITAL_COMMUNITY): Payer: Self-pay

## 2023-08-30 DIAGNOSIS — S72144D Nondisplaced intertrochanteric fracture of right femur, subsequent encounter for closed fracture with routine healing: Secondary | ICD-10-CM | POA: Diagnosis not present

## 2023-09-05 ENCOUNTER — Other Ambulatory Visit: Payer: Self-pay

## 2023-09-06 DIAGNOSIS — B839 Helminthiasis, unspecified: Secondary | ICD-10-CM | POA: Diagnosis not present

## 2023-09-08 NOTE — Progress Notes (Signed)
 Cardiology Office Note:    Date:  09/12/2023   ID:  Jason Mcconnell, DOB 25-Feb-1930, MRN [id number]  PCP:  Keren Vicenta BRAVO, MD  Cardiologist:  Redell Leiter, MD    Referring MD: Keren Vicenta BRAVO, MD    ASSESSMENT:    1. Paroxysmal atrial fibrillation (HCC)   2. SVT (supraventricular tachycardia) (HCC)   3. PVC's (premature ventricular contractions)   4. Chronic anticoagulation   5. Coronary artery calcification seen on CT scan   6. Dyslipidemia   7. Vitamin B 12 deficiency    PLAN:    In order of problems listed above:  He continues to do well with atrial fibrillation no symptomatic recurrence he will continue anticoagulation along with low-dose Cardizem  Continue his current anticoagulant no bleeding complication Currently not on lipid-lowering therapy Complains bitterly of pruritus will transition from 1 oral anticoagulant to another if this does not help him he may need to stop his calcium channel blocker and switch to low-dose beta-blocker   Next appointment: I will plan to see him in 1 year   Medication Adjustments/Labs and Tests Ordered: Current medicines are reviewed at length with the patient today.  Concerns regarding medicines are outlined above.  No orders of the defined types were placed in this encounter.  No orders of the defined types were placed in this encounter.    History of Present Illness:    Jason Mcconnell is a 89 y.o. male with a hx of paroxysmal atrial fibrillation with chronic anticoagulation SVT symptomatic PVCs hyperlipidemia and coronary artery calcification on CT scan last seen 09/13/2023.  He was recently evaluated by hematology for pancytopenia associated with B12 deficiency.  Also underwent ORIF right femur fracture 07/14/2023 And labs with his PCP 06/14/2023 CBC showed anemia hemoglobin 10.8 platelets are diminished 104,000 white count was decreased to 2200 pancytopenia. Creatinine 1.33 GFR 51 cc/min stage IIIa CKD  potassium 4.7 sodium 139 liver function test were normal. Cholesterol 137 LDL 81 non-HDL cholesterol 86 hemoglobin A1c 6.0%.  Compliance with diet, lifestyle and medications: Yes  In the interim he had a fall hip fracture ORIF and has made a nice recovery He is not having palpitation syncope chest pain or shortness of breath. He complains of pruritus but no rash wonders if it is due to Eliquis  and at the end of the month says he exhausted his present prescription he will transition to Xarelto  as a trial He tried to capture a monitor strip off his smart watch but the band is too loose and I gave him instructions to buy a Velcro band that give skin contact so he can self monitor his rhythm at home He had an EKG in June I independently reviewed sinus rhythm normal Past Medical History:  Diagnosis Date   Arthritis    Atrial fibrillation (HCC)    a. Dx 08/02/18 at [facility]   Back pain    Closed right hip fracture, initial encounter [facility]) 07/11/2023   Coronary artery calcification seen on CT scan    EPIDIDYMAL CYST 12/19/2006   Qualifier: Diagnosis of  By: Harlow MD, Ozell BRAVO    Fracture of distal end of radius 11/12/2017   GERD (gastroesophageal reflux disease)    Ingrown nail 07/24/2015   Leukopenia 09/06/2021   Other malaise and fatigue 09/13/2007   Qualifier: Diagnosis of  By: Harlow MD, Ozell BRAVO    Pain in soft tissues of limb 07/24/2015   Pain of right hip joint 07/07/2023   Pancytopenia (HCC)  09/06/2021   Wrist pain 08/21/2018    Current Medications: Current Meds  Medication Sig   cyanocobalamin  (VITAMIN B12) 1000 MCG/ML injection 1000 mcg weekly x 4 weeks   diltiazem  (CARDIZEM ) 30 MG tablet Take 1 tablet (30 mg total) by mouth 2 (two) times daily. (Patient taking differently: Take 30 mg by mouth 2 (two) times daily as needed (Palpitations).)   diphenhydrAMINE (BENADRYL ALLERGY) 25 MG tablet Take 25-50 mg by mouth every 6 (six) hours as needed for itching or allergies.    docusate sodium  (COLACE) 100 MG capsule Take 1 capsule (100 mg total) by mouth 2 (two) times daily.   magnesium  hydroxide (MILK OF MAGNESIA) 400 MG/5ML suspension Take 30 mLs by mouth daily as needed for mild constipation.   polyethylene glycol powder (GLYCOLAX /MIRALAX ) 17 GM/SCOOP powder Take 17 grams dissolved in liquid by mouth daily as needed for mild constipation.   [DISCONTINUED] ELIQUIS  5 MG TABS tablet TAKE 1 TABLET BY MOUTH TWO TIMES DAILY.      EKGs/Labs/Other Studies Reviewed:    The following studies were reviewed today:  Cardiac Studies & Procedures   ______________________________________________________________________________________________        SHERRILEE  LONG TERM MONITOR (3-14 DAYS) 01/08/2022  Narrative Patch Wear Time:  13 days and 21 hours (2023-12-04T09:13:47-0500 to 2023-12-18T06:36:28-0500)  Patient had a min HR of 50 bpm, max HR of 194 bpm, and avg HR of 66 bpm. Predominant underlying rhythm was Sinus Rhythm.  There were no pauses of 3 seconds or greater and no episodes of second or third-degree AV nodal block or sinus node exit block.  There were 10 triggered and 10 diary events all sinus rhythm and often associated with PVCs and less frequently APCs.  1 run of Ventricular Tachycardia occurred lasting 5 beats with a max rate of 117 bpm (avg 105 bpm).  105 Supraventricular Tachycardia runs occurred, the run with the fastest interval lasting 4 beats with a max rate of 194 bpm, the longest lasting 13.4 secs with an avg rate of 100 bpm.  Isolated SVEs were rare (<1.0%), SVE Couplets were rare (<1.0%), and SVE Triplets were rare (<1.0%).  Isolated VEs were rare (<1.0%), VE Couplets were rare (<1.0%), and no VE Triplets were present.       ______________________________________________________________________________________________          Recent Labs: 08/05/2023: ALT 6; BUN 25; Creatinine 1.54; Hemoglobin 9.4; Platelet Count 109; Potassium  4.2; Sodium 139  Recent Lipid Panel    Component Value Date/Time   CHOL 113 10/01/2022 0826   TRIG 54 10/01/2022 0826   HDL 44 10/01/2022 0826   CHOLHDL 2.6 10/01/2022 0826   CHOLHDL 3 05/12/2021 1344   VLDL 14.2 05/12/2021 1344   LDLCALC 57 10/01/2022 0826    Physical Exam:    VS:  BP 120/60   Pulse 66   Ht 6' (1.829 m)   Wt 144 lb 9.6 oz (65.6 kg)   SpO2 99%   BMI 19.61 kg/m     Wt Readings from Last 3 Encounters:  09/12/23 144 lb 9.6 oz (65.6 kg)  08/05/23 147 lb 12.8 oz (67 kg)  07/14/23 149 lb 14.6 oz (68 kg)     GEN:  Well nourished, well developed in no acute distress HEENT: Normal NECK: No JVD; No carotid bruits LYMPHATICS: No lymphadenopathy CARDIAC: RRR, no murmurs, rubs, gallops RESPIRATORY:  Clear to auscultation without rales, wheezing or rhonchi  ABDOMEN: Soft, non-tender, non-distended MUSCULOSKELETAL:  No edema; No deformity  SKIN: Warm and dry  NEUROLOGIC:  Alert and oriented x 3 PSYCHIATRIC:  Normal affect    Signed, Redell Leiter, MD  09/12/2023 9:30 AM     [facility]

## 2023-09-12 ENCOUNTER — Encounter: Payer: Self-pay | Admitting: Cardiology

## 2023-09-12 ENCOUNTER — Ambulatory Visit: Attending: Cardiology | Admitting: Cardiology

## 2023-09-12 VITALS — BP 120/60 | HR 66 | Ht 72.0 in | Wt 144.6 lb

## 2023-09-12 DIAGNOSIS — I480 Paroxysmal atrial fibrillation: Secondary | ICD-10-CM

## 2023-09-12 DIAGNOSIS — L299 Pruritus, unspecified: Secondary | ICD-10-CM

## 2023-09-12 DIAGNOSIS — I493 Ventricular premature depolarization: Secondary | ICD-10-CM

## 2023-09-12 DIAGNOSIS — Z7901 Long term (current) use of anticoagulants: Secondary | ICD-10-CM | POA: Diagnosis not present

## 2023-09-12 DIAGNOSIS — E785 Hyperlipidemia, unspecified: Secondary | ICD-10-CM

## 2023-09-12 DIAGNOSIS — E538 Deficiency of other specified B group vitamins: Secondary | ICD-10-CM

## 2023-09-12 DIAGNOSIS — I471 Supraventricular tachycardia, unspecified: Secondary | ICD-10-CM | POA: Diagnosis not present

## 2023-09-12 DIAGNOSIS — I2510 Atherosclerotic heart disease of native coronary artery without angina pectoris: Secondary | ICD-10-CM

## 2023-09-12 MED ORDER — RIVAROXABAN 20 MG PO TABS: 20.0000 mg | ORAL_TABLET | Freq: Every day | ORAL | 3 refills | Status: AC

## 2023-09-12 NOTE — Patient Instructions (Signed)
 Medication Instructions:  Your physician has recommended you make the following change in your medication:   STOP: Eliquis  START: Xarelto  20 mg daily  *If you need a refill on your cardiac medications before your next appointment, please call your pharmacy*  Lab Work: None If you have labs (blood work) drawn today and your tests are completely normal, you will receive your results only by: MyChart Message (if you have MyChart) OR A paper copy in the mail If you have any lab test that is abnormal or we need to change your treatment, we will call you to review the results.  Testing/Procedures: None  Follow-Up: At [facility], you and your health needs are our priority.  As part of our continuing mission to provide you with exceptional heart care, our providers are all part of one team.  This team includes your primary Cardiologist (physician) and Advanced Practice Providers or APPs (Physician Assistants and Nurse Practitioners) who all work together to provide you with the care you need, when you need it.  Your next appointment:   1 year(s)  Provider:   Redell Leiter, MD    We recommend signing up for the patient portal called MyChart.  Sign up information is provided on this After Visit Summary.  MyChart is used to connect with patients for Virtual Visits (Telemedicine).  Patients are able to view lab/test results, encounter notes, upcoming appointments, etc.  Non-urgent messages can be sent to your provider as well.   To learn more about what you can do with MyChart, go to [email or web address].   Other Instructions Get a new velcro watch band  [employer] to give good skin contact.

## 2023-09-13 ENCOUNTER — Other Ambulatory Visit (HOSPITAL_COMMUNITY): Payer: Self-pay

## 2023-09-15 ENCOUNTER — Other Ambulatory Visit: Payer: Self-pay | Admitting: Hematology and Oncology

## 2023-09-15 MED ORDER — CYANOCOBALAMIN 1000 MCG/ML IJ SOLN: INTRAMUSCULAR | 2 refills | Status: AC

## 2023-10-05 ENCOUNTER — Encounter (INDEPENDENT_AMBULATORY_CARE_PROVIDER_SITE_OTHER): Admitting: Ophthalmology

## 2023-10-05 ENCOUNTER — Other Ambulatory Visit: Payer: Self-pay

## 2023-10-05 DIAGNOSIS — H43813 Vitreous degeneration, bilateral: Secondary | ICD-10-CM

## 2023-10-05 DIAGNOSIS — H353132 Nonexudative age-related macular degeneration, bilateral, intermediate dry stage: Secondary | ICD-10-CM

## 2023-10-05 DIAGNOSIS — H34812 Central retinal vein occlusion, left eye, with macular edema: Secondary | ICD-10-CM | POA: Diagnosis not present

## 2023-10-05 NOTE — Progress Notes (Unsigned)
 [facility]  [address] [place],  [place]  [address] [phone]  Clinic Day:  10/05/2023  Referring physician: Keren Vicenta BRAVO, MD   HISTORY OF PRESENT ILLNESS:  The patient is a 89 y.o. male with pancytopenia.  As he was borderline B12 deficient, the patient has been taking B12 injections in hopes of improving both his cobalamin stores and his peripheral counts.  He comes in today to reassess these parameters.     both mild leukopenia and anemia.  Previous labs have shown mild pancytopenia.  However, as his counts have been stable, he has been followed conservatively.  He comes in today to reassess his peripheral counts.  Since his last visit, the patient has run into a few issues.  He fell a few weeks ago, which led to a right hip fracture.  His wife claims that he is also weaker and does not eat as much as he previously did.  The patient denies having any overt forms of blood loss, but admits to being more fatigued.  PHYSICAL EXAM:  There were no vitals taken for this visit. Wt Readings from Last 3 Encounters:  09/12/23 144 lb 9.6 oz (65.6 kg)  08/05/23 147 lb 12.8 oz (67 kg)  07/14/23 149 lb 14.6 oz (68 kg)   There is no height or weight on file to calculate BMI. Performance status (ECOG): 1 - Symptomatic but completely ambulatory Physical Exam Constitutional:      Appearance: Normal appearance. He is not ill-appearing.     Comments: A pleasant older gentleman who looks weaker versus previous visits.  He is ambulating with a cane  HENT:     Mouth/Throat:     Mouth: Mucous membranes are moist.     Pharynx: Oropharynx is clear. No oropharyngeal exudate or posterior oropharyngeal erythema.  Cardiovascular:     Rate and Rhythm: Normal rate and regular rhythm.     Heart sounds: No murmur heard.    No friction rub. No gallop.  Pulmonary:     Effort: Pulmonary effort is normal. No respiratory distress.     Breath sounds: Normal breath  sounds. No wheezing, rhonchi or rales.  Abdominal:     General: Bowel sounds are normal. There is no distension.     Palpations: Abdomen is soft. There is no mass.     Tenderness: There is no abdominal tenderness.  Musculoskeletal:        General: No swelling.     Right lower leg: No edema.     Left lower leg: No edema.  Lymphadenopathy:     Cervical: No cervical adenopathy.     Upper Body:     Right upper body: No supraclavicular or axillary adenopathy.     Left upper body: No supraclavicular or axillary adenopathy.     Lower Body: No right inguinal adenopathy. No left inguinal adenopathy.  Skin:    General: Skin is warm.     Coloration: Skin is not jaundiced.     Findings: No lesion or rash.  Neurological:     General: No focal deficit present.     Mental Status: He is alert and oriented to person, place, and time. Mental status is at baseline.  Psychiatric:        Mood and Affect: Mood normal.        Behavior: Behavior normal.        Thought Content: Thought content normal.    LABS:      Latest  Ref Rng & Units 08/05/2023    9:23 AM 07/15/2023    3:22 AM 07/14/2023    9:53 AM  CBC  WBC 4.0 - 10.5 K/uL 2.3  1.7  1.8   Hemoglobin 13.0 - 17.0 g/dL 9.4  9.7  89.7   Hematocrit 39.0 - 52.0 % 29.6  30.9  32.2   Platelets 150 - 400 K/uL 109  157  167       Latest Ref Rng & Units 08/05/2023    9:23 AM 07/15/2023    3:22 AM 07/14/2023    9:53 AM  CMP  Glucose 70 - 99 mg/dL 873  837  884   BUN 8 - 23 mg/dL 25  23  20    Creatinine 0.61 - 1.24 mg/dL 8.45  8.84  8.93   Sodium 135 - 145 mmol/L 139  134  136   Potassium 3.5 - 5.1 mmol/L 4.2  4.6  4.7   Chloride 98 - 111 mmol/L 104  100  102   CO2 22 - 32 mmol/L 25  26  27    Calcium 8.9 - 10.3 mg/dL 9.3  8.9  9.2   Total Protein 6.5 - 8.1 g/dL 6.5     Total Bilirubin 0.0 - 1.2 mg/dL 0.5     Alkaline Phos 38 - 126 U/L 144     AST 15 - 41 U/L 19     ALT 0 - 44 U/L 6       Latest Reference Range & Units 08/05/23 09:23  Iron 45 -  182 ug/dL 77  UIBC ug/dL 795  TIBC 749 - 549 ug/dL 718  Saturation Ratios 17.9 - 39.5 % 27  Ferritin 24 - 336 ng/mL 92  Folate >5.9 ng/mL 15.3  Vitamin B12 180 - 914 pg/mL 190    ASSESSMENT & PLAN:  Assessment/Plan:  A 89 y.o. male whose labs today clearly show pancytopenia.  All 3 blood cell lines are lower today than when I last saw him in January 2025.  His labs today show borderline low B12 levels.  Based upon this, we will arrange for him to receive weekly B12 shots x 2 more weeks.  After that, I will consider giving him a B12 shot monthly. I will see him back in 2 months for repeat clinical assessment.  If his pancytopenia does not improve over time, a bone marrow biopsy will be done to ensure an intrinsic marrow disorder, such as myelodysplasia, is not present.  The patient understands all the plans discussed today and is in agreement with them.     Ravin Denardo DELENA Kerns, MD

## 2023-10-06 ENCOUNTER — Inpatient Hospital Stay

## 2023-10-06 ENCOUNTER — Telehealth: Payer: Self-pay | Admitting: Oncology

## 2023-10-06 ENCOUNTER — Other Ambulatory Visit: Payer: Self-pay | Admitting: Oncology

## 2023-10-06 ENCOUNTER — Inpatient Hospital Stay: Attending: Oncology | Admitting: Oncology

## 2023-10-06 ENCOUNTER — Telehealth: Payer: Self-pay

## 2023-10-06 VITALS — BP 145/66 | HR 62 | Temp 97.5°F | Resp 16 | Ht 72.0 in | Wt 147.0 lb

## 2023-10-06 DIAGNOSIS — D61818 Other pancytopenia: Secondary | ICD-10-CM

## 2023-10-06 DIAGNOSIS — E538 Deficiency of other specified B group vitamins: Secondary | ICD-10-CM | POA: Diagnosis not present

## 2023-10-06 DIAGNOSIS — D649 Anemia, unspecified: Secondary | ICD-10-CM | POA: Insufficient documentation

## 2023-10-06 DIAGNOSIS — D72819 Decreased white blood cell count, unspecified: Secondary | ICD-10-CM

## 2023-10-06 LAB — CBC WITH DIFFERENTIAL (CANCER CENTER ONLY)
Abs Immature Granulocytes: 0 K/uL (ref 0.00–0.07)
Basophils Absolute: 0 K/uL (ref 0.0–0.1)
Basophils Relative: 1 %
Eosinophils Absolute: 0 K/uL (ref 0.0–0.5)
Eosinophils Relative: 1 %
HCT: 32.1 % — ABNORMAL LOW (ref 39.0–52.0)
Hemoglobin: 10.5 g/dL — ABNORMAL LOW (ref 13.0–17.0)
Immature Granulocytes: 0 %
Lymphocytes Relative: 46 %
Lymphs Abs: 1 K/uL (ref 0.7–4.0)
MCH: 32.5 pg (ref 26.0–34.0)
MCHC: 32.7 g/dL (ref 30.0–36.0)
MCV: 99.4 fL (ref 80.0–100.0)
Monocytes Absolute: 0.3 K/uL (ref 0.1–1.0)
Monocytes Relative: 12 %
Neutro Abs: 0.9 K/uL — ABNORMAL LOW (ref 1.7–7.7)
Neutrophils Relative %: 40 %
Platelet Count: 109 K/uL — ABNORMAL LOW (ref 150–400)
RBC: 3.23 MIL/uL — ABNORMAL LOW (ref 4.22–5.81)
RDW: 14.8 % (ref 11.5–15.5)
WBC Count: 2.2 K/uL — ABNORMAL LOW (ref 4.0–10.5)
nRBC: 0 % (ref 0.0–0.2)

## 2023-10-06 LAB — CMP (CANCER CENTER ONLY)
ALT: 7 U/L (ref 0–44)
AST: 18 U/L (ref 15–41)
Albumin: 3.7 g/dL (ref 3.5–5.0)
Alkaline Phosphatase: 113 U/L (ref 38–126)
Anion gap: 11 (ref 5–15)
BUN: 20 mg/dL (ref 8–23)
CO2: 25 mmol/L (ref 22–32)
Calcium: 9 mg/dL (ref 8.9–10.3)
Chloride: 104 mmol/L (ref 98–111)
Creatinine: 1.23 mg/dL (ref 0.61–1.24)
GFR, Estimated: 55 mL/min — ABNORMAL LOW (ref >60)
Glucose, Bld: 84 mg/dL (ref 70–99)
Potassium: 4.1 mmol/L (ref 3.5–5.1)
Sodium: 139 mmol/L (ref 135–145)
Total Bilirubin: 0.4 mg/dL (ref 0.0–1.2)
Total Protein: 6.6 g/dL (ref 6.5–8.1)

## 2023-10-06 LAB — IRON AND TIBC
Iron: 68 ug/dL (ref 45–182)
Saturation Ratios: 23 % (ref 17.9–39.5)
TIBC: 291 ug/dL (ref 250–450)
UIBC: 223 ug/dL

## 2023-10-06 LAB — VITAMIN B12: Vitamin B-12: 774 pg/mL (ref 180–914)

## 2023-10-06 LAB — FERRITIN: Ferritin: 61 ng/mL (ref 24–336)

## 2023-10-06 LAB — FOLATE: Folate: 14.2 ng/mL (ref >5.9)

## 2023-10-06 NOTE — Telephone Encounter (Signed)
 LM WITH [facility] TO REFILL VITAMIN B12 INJECTIONS PER DR. EZZARD.

## 2023-10-06 NOTE — Telephone Encounter (Signed)
 Patient has been scheduled for follow-up visit per 10/05/23 LOS.  Pt given an appt calendar with date and time.

## 2023-10-18 DIAGNOSIS — Z9181 History of falling: Secondary | ICD-10-CM | POA: Diagnosis not present

## 2023-10-18 DIAGNOSIS — Z0000 Encounter for general adult medical examination without abnormal findings: Secondary | ICD-10-CM | POA: Diagnosis not present

## 2023-10-31 DIAGNOSIS — L821 Other seborrheic keratosis: Secondary | ICD-10-CM | POA: Diagnosis not present

## 2023-10-31 DIAGNOSIS — L209 Atopic dermatitis, unspecified: Secondary | ICD-10-CM | POA: Diagnosis not present

## 2023-10-31 DIAGNOSIS — L820 Inflamed seborrheic keratosis: Secondary | ICD-10-CM | POA: Diagnosis not present

## 2023-10-31 DIAGNOSIS — L720 Epidermal cyst: Secondary | ICD-10-CM | POA: Diagnosis not present

## 2023-10-31 DIAGNOSIS — L578 Other skin changes due to chronic exposure to nonionizing radiation: Secondary | ICD-10-CM | POA: Diagnosis not present

## 2023-10-31 DIAGNOSIS — L570 Actinic keratosis: Secondary | ICD-10-CM | POA: Diagnosis not present

## 2023-11-16 DIAGNOSIS — Z23 Encounter for immunization: Secondary | ICD-10-CM | POA: Diagnosis not present

## 2023-12-15 LAB — LAB REPORT - SCANNED
A1c: 5.7
EGFR: 53

## 2023-12-16 IMAGING — MR MR HEAD W/O CM
12 of 13 series · 44 of 48 positions shown · non-contrast
Comparison: None.

CLINICAL DATA: Stroke follow-up

EXAM:
MRI HEAD WITHOUT CONTRAST
TECHNIQUE: Multiplanar, multiecho pulse sequences of the brain and surrounding
structures were obtained without intravenous contrast.

[Series 5: DWI · axial · 3.0mm · 0.92mm/px · z∈[-83,+80]mm · 8 of 112 slices shown (1 of 4)]
[im 1/112]
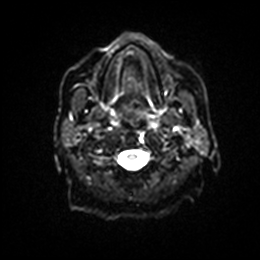
[im 16/112]
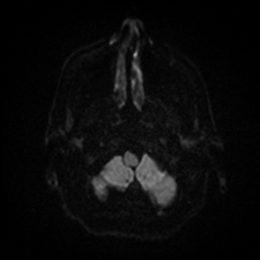
[im 32/112]
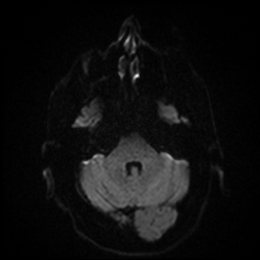
[im 48/112]
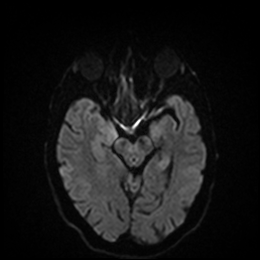
[im 64/112]
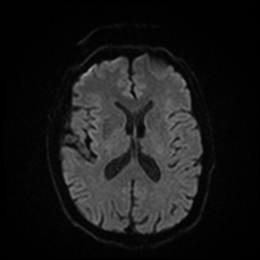
[im 80/112]
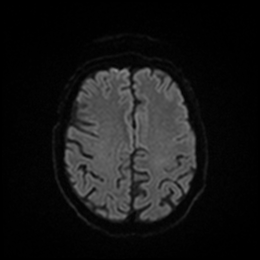
[im 96/112]
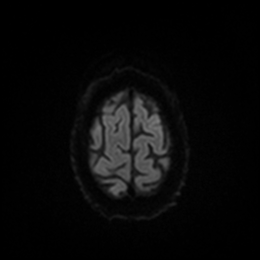
[im 112/112]
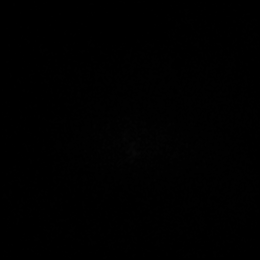

[Series 6: DWI · axial · 3.0mm · 0.92mm/px · z∈[-83,+80]mm · 4 of 50 slices shown (2 of 4)]
[im 1/50]
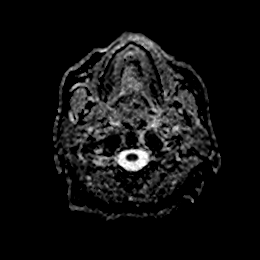
[im 17/50]
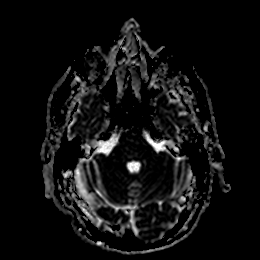
[im 33/50]
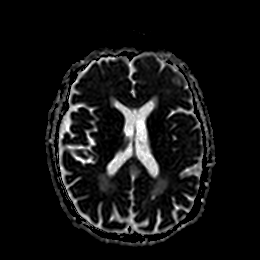
[im 50/50]
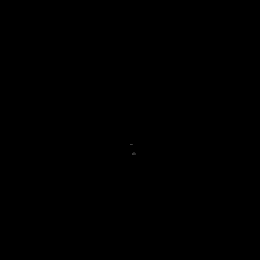

[Series 7: DWI · coronal · 4.0mm · 0.88mm/px · 6 of 81 slices shown (3 of 4)]
[im 1/81]
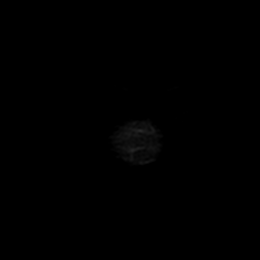
[im 17/81]
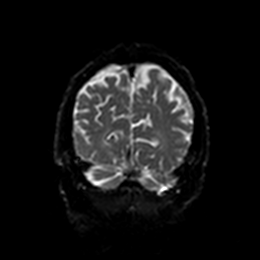
[im 33/81]
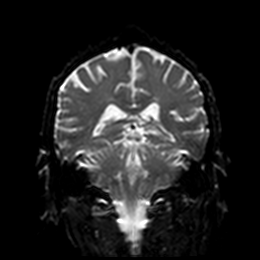
[im 49/81]
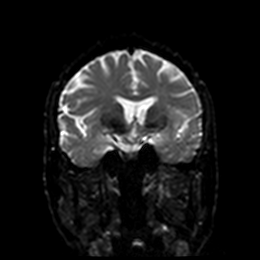
[im 65/81]
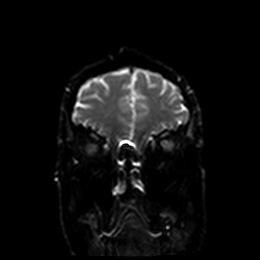
[im 81/81]
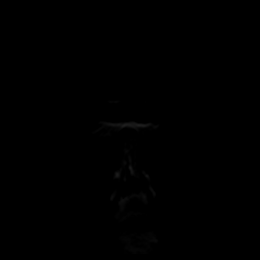

[Series 8: DWI · coronal · 4.0mm · 0.88mm/px · 3 of 41 slices shown (4 of 4)]
[im 1/41]
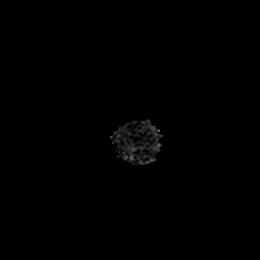
[im 21/41]
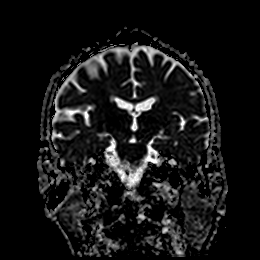
[im 41/41]
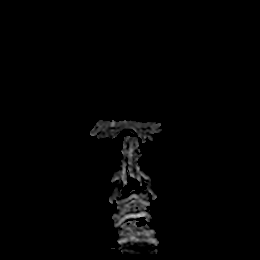

[Series 9: T1 · sagittal · 5.0mm · 0.75mm/px · 2 of 27 slices shown]
[im 1/27]
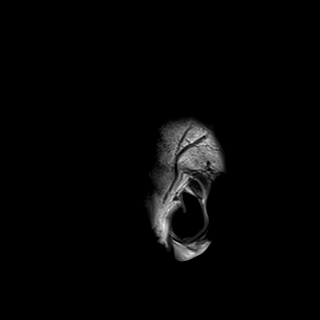
[im 27/27]
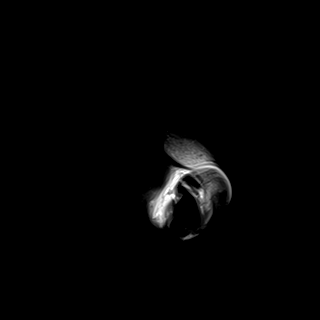

[Series 10: T2 · axial · 5.0mm · 0.75mm/px · z∈[-84,+82]mm · 2 of 29 slices shown (1 of 2)]
[im 1/29]
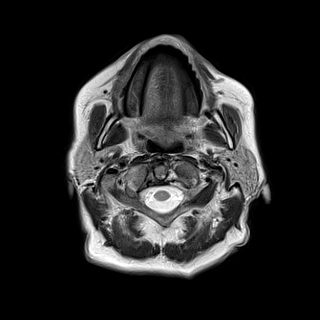
[im 29/29]
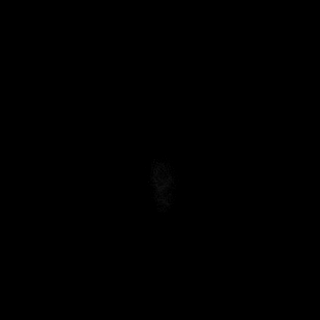

[Series 11: FLAIR · axial · 5.0mm · 0.47mm/px · z∈[-87,+79]mm · 2 of 29 slices shown]
[im 1/29]
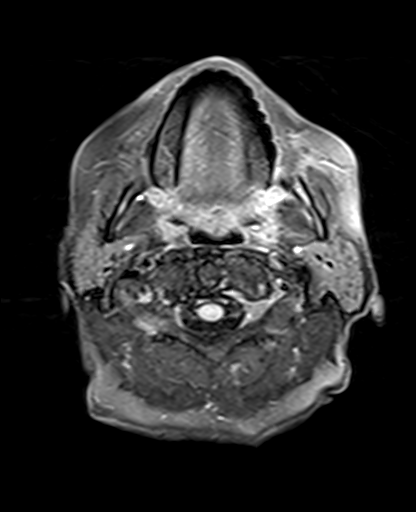
[im 29/29]
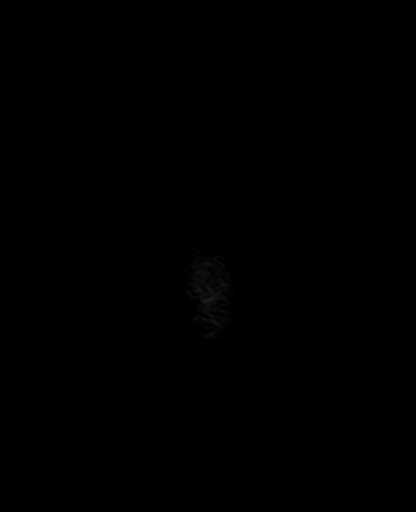

[Series 12: mag_images · axial · 3.0mm · 0.94mm/px · z∈[-86,+77]mm · 4 of 56 slices shown]
[im 1/56]
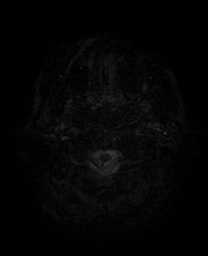
[im 19/56]
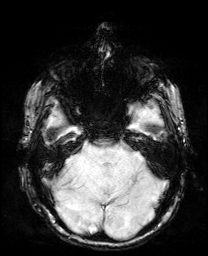
[im 37/56]
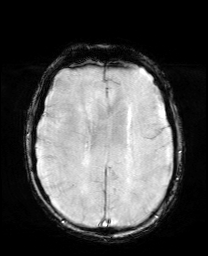
[im 56/56]
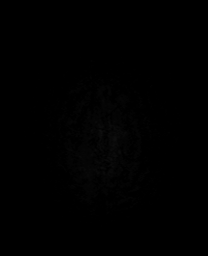

[Series 13: pha_images · axial · 3.0mm · 0.94mm/px · z∈[-86,+71]mm · 4 of 54 slices shown]
[im 1/54]
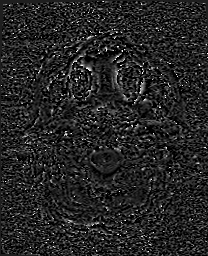
[im 18/54]
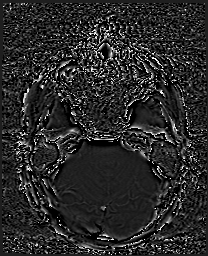
[im 36/54]
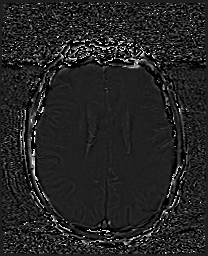
[im 54/54]
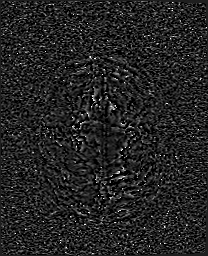

[Series 14: swi_images · axial · 3.0mm · 0.94mm/px · z∈[-86,+77]mm · 4 of 56 slices shown]
[im 1/56]
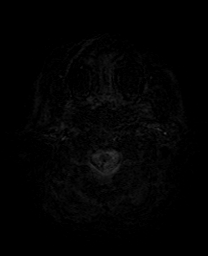
[im 19/56]
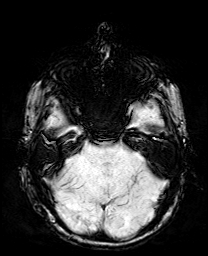
[im 37/56]
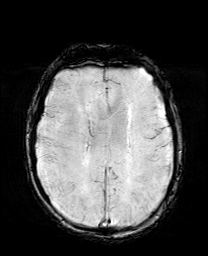
[im 56/56]
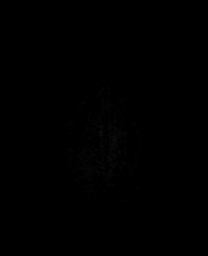

[Series 15: mip_images(sw) · axial · 24.0mm · 0.94mm/px · z∈[-75,+67]mm · 3 of 49 slices shown]
[im 1/49]
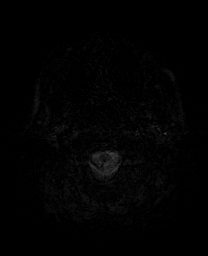
[im 25/49]
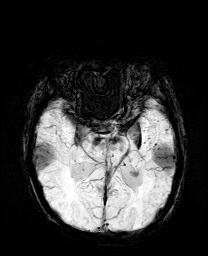
[im 49/49]
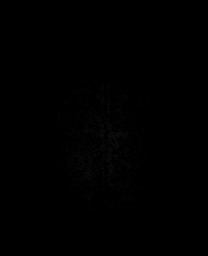

[Series 17: T2 · coronal · 5.0mm · 0.34mm/px · 2 of 35 slices shown (2 of 2)]
[im 1/35]
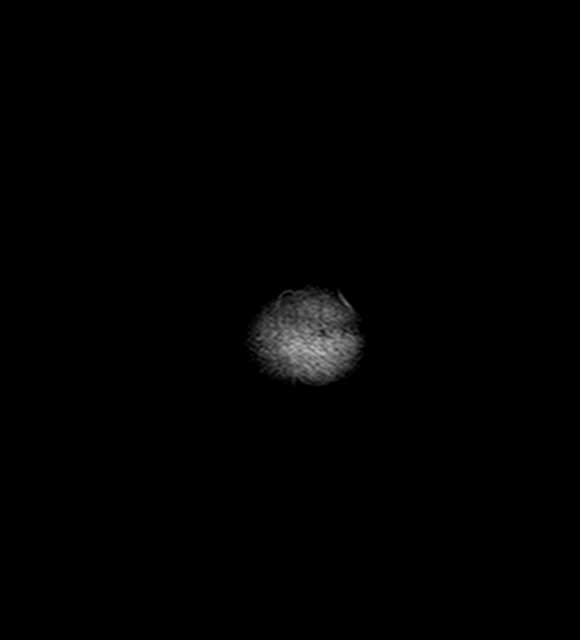
[im 35/35]
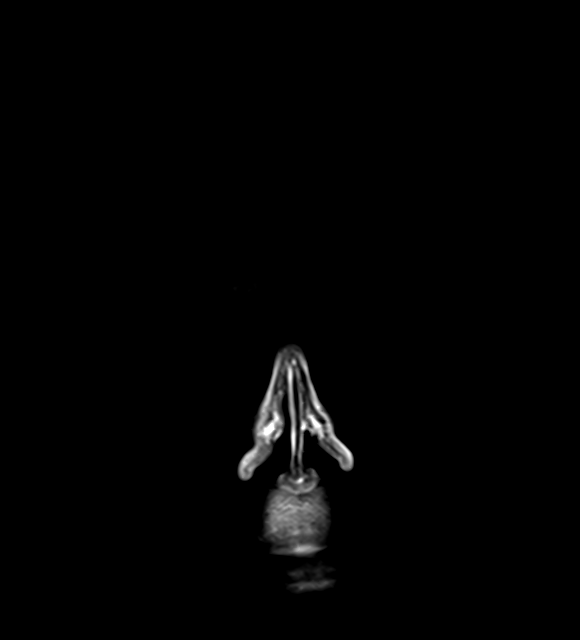

[44 of 48 positions shown; findings below may reference images not displayed]

FINDINGS: Brain: No acute infarct, mass effect or extra-axial collection.
Multifocal chronic microhemorrhage in the left hemisphere. There is
multifocal hyperintense T2-weighted signal within the white matter.
Generalized volume loss without a clear lobar predilection. The
midline structures are normal.

Vascular: Major flow voids are preserved.

Skull and upper cervical spine: Normal calvarium and skull base.
Visualized upper cervical spine and soft tissues are normal.

Sinuses/Orbits:No paranasal sinus fluid levels or advanced mucosal
thickening. No mastoid or middle ear effusion. Normal orbits.
IMPRESSION: 1. No acute intracranial abnormality.
2. Findings of chronic microvascular ischemia and volume loss.

## 2023-12-29 ENCOUNTER — Telehealth (HOSPITAL_BASED_OUTPATIENT_CLINIC_OR_DEPARTMENT_OTHER): Payer: Self-pay

## 2023-12-29 NOTE — Telephone Encounter (Signed)
° °  Pre-operative Risk Assessment    Patient Name: Jason Mcconnell  DOB: Feb 20, 1930 MRN: [id number]   Date of last office visit: 09/12/2023 Dr. Monetta Date of next office visit: None  Request for Surgical Clearance    Procedure:  Bilateral inguinal herniorrhaphy with mesh, open scheduled  Date of Surgery:  Clearance 02/03/24                                 Surgeon:  Dr. Allana Socks Group or Practice Name:  [facility] Phone number:  [phone]  Fax number:  [phone]   Type of Clearance Requested:   - Medical  - Pharmacy:  Hold Rivaroxaban  (Xarelto ) -Please advise.   Type of Anesthesia:  General    Additional requests/questions:  Dr. Allana would like for Dr. Monetta to see pt prior to surgery.  SignedPatrcia Hong L   12/29/2023, 9:19 AM

## 2024-01-02 ENCOUNTER — Other Ambulatory Visit: Payer: Self-pay | Admitting: Cardiology

## 2024-01-05 NOTE — Telephone Encounter (Signed)
°  Patient Consent for Virtual Visit         Jason Mcconnell has provided verbal consent on 01/05/2024 for a virtual visit (video or telephone).  Pt is scheduled for 01/30/24, and Med Rec and consent are complete   CONSENT FOR VIRTUAL VISIT FOR:  Jason Mcconnell  By participating in this virtual visit I agree to the following:  I hereby voluntarily request, consent and authorize [facility] and its employed or contracted physicians, physician assistants, nurse practitioners or other licensed health care professionals (the Practitioner), to provide me with telemedicine health care services (the Services) as deemed necessary by the treating Practitioner. I acknowledge and consent to receive the Services by the Practitioner via telemedicine. I understand that the telemedicine visit will involve communicating with the Practitioner through live audiovisual communication technology and the disclosure of certain medical information by electronic transmission. I acknowledge that I have been given the opportunity to request an in-person assessment or other available alternative prior to the telemedicine visit and am voluntarily participating in the telemedicine visit.  I understand that I have the right to withhold or withdraw my consent to the use of telemedicine in the course of my care at any time, without affecting my right to future care or treatment, and that the Practitioner or I may terminate the telemedicine visit at any time. I understand that I have the right to inspect all information obtained and/or recorded in the course of the telemedicine visit and may receive copies of available information for a reasonable fee.  I understand that some of the potential risks of receiving the Services via telemedicine include:  Delay or interruption in medical evaluation due to technological equipment failure or disruption; Information transmitted may not be sufficient (e.g.  poor resolution of images) to allow for appropriate medical decision making by the Practitioner; and/or  In rare instances, security protocols could fail, causing a breach of personal health information.  Furthermore, I acknowledge that it is my responsibility to provide information about my medical history, conditions and care that is complete and accurate to the best of my ability. I acknowledge that Practitioner's advice, recommendations, and/or decision may be based on factors not within their control, such as incomplete or inaccurate data provided by me or distortions of diagnostic images or specimens that may result from electronic transmissions. I understand that the practice of medicine is not an exact science and that Practitioner makes no warranties or guarantees regarding treatment outcomes. I acknowledge that a copy of this consent can be made available to me via my patient portal [facility] MyChart), or I can request a printed copy by calling the office of  [facility].    I understand that my insurance will be billed for this visit.   I have read or had this consent read to me. I understand the contents of this consent, which adequately explains the benefits and risks of the Services being provided via telemedicine.  I have been provided ample opportunity to ask questions regarding this consent and the Services and have had my questions answered to my satisfaction. I give my informed consent for the services to be provided through the use of telemedicine in my medical care

## 2024-01-05 NOTE — Telephone Encounter (Signed)
° °  Name: Jason Mcconnell  DOB: 25-Jun-1930  MRN: [id number]  Primary Cardiologist: None   Preoperative team, please contact this patient and set up a phone call appointment for further preoperative risk assessment. Please obtain consent and complete medication review. Thank you for your help.  I confirm that guidance regarding antiplatelet and oral anticoagulation therapy has been completed and, if necessary, noted below.  Patient has not had an Afib/aflutter ablation in the last 3 months, DCCV within the last 4 weeks or a watchman implanted in the last 45 days    Per office protocol, patient can hold Xarelto  for 2 days prior to procedure.   I also confirmed the patient resides in the state of [place] . As per [facility] Medical Board telemedicine laws, the patient must reside in the state in which the provider is licensed.   Brayson Livesey E Clarice Zulauf, NP 01/05/2024, 1:08 PM [facility]

## 2024-01-05 NOTE — Telephone Encounter (Signed)
 Patient with diagnosis of afib on Xarelto  for anticoagulation.    Procedure: Bilateral inguinal herniorrhaphy with mesh, open scheduled  Date of procedure: 02/03/24   CHA2DS2-VASc Score = 3   This indicates a 3.2% annual risk of stroke. The patient's score is based upon: CHF History: 0 HTN History: 0 Diabetes History: 0 Stroke History: 0 Vascular Disease History: 1 Age Score: 2 Gender Score: 0      CrCl 43 ml/min Platelet count 109  Patient has not had an Afib/aflutter ablation in the last 3 months, DCCV within the last 4 weeks or a watchman implanted in the last 45 days   Per office protocol, patient can hold Xarelto  for 2 days prior to procedure.    **This guidance is not considered finalized until pre-operative APP has relayed final recommendations.**

## 2024-01-17 ENCOUNTER — Other Ambulatory Visit: Payer: Self-pay | Admitting: Oncology

## 2024-01-17 ENCOUNTER — Inpatient Hospital Stay: Attending: Oncology

## 2024-01-17 ENCOUNTER — Inpatient Hospital Stay: Admitting: Oncology

## 2024-01-17 VITALS — BP 134/65 | HR 80 | Temp 97.8°F | Resp 16 | Ht 72.0 in | Wt 148.6 lb

## 2024-01-17 DIAGNOSIS — D72819 Decreased white blood cell count, unspecified: Secondary | ICD-10-CM

## 2024-01-17 DIAGNOSIS — D61818 Other pancytopenia: Secondary | ICD-10-CM

## 2024-01-17 LAB — CBC WITH DIFFERENTIAL (CANCER CENTER ONLY)
Abs Immature Granulocytes: 0.02 K/uL (ref 0.00–0.07)
Basophils Absolute: 0 K/uL (ref 0.0–0.1)
Basophils Relative: 0 %
Eosinophils Absolute: 0 K/uL (ref 0.0–0.5)
Eosinophils Relative: 0 %
HCT: 31.9 % — ABNORMAL LOW (ref 39.0–52.0)
Hemoglobin: 10.1 g/dL — ABNORMAL LOW (ref 13.0–17.0)
Immature Granulocytes: 1 %
Lymphocytes Relative: 42 %
Lymphs Abs: 1 K/uL (ref 0.7–4.0)
MCH: 32.1 pg (ref 26.0–34.0)
MCHC: 31.7 g/dL (ref 30.0–36.0)
MCV: 101.3 fL — ABNORMAL HIGH (ref 80.0–100.0)
Monocytes Absolute: 0.3 K/uL (ref 0.1–1.0)
Monocytes Relative: 13 %
Neutro Abs: 1.1 K/uL — ABNORMAL LOW (ref 1.7–7.7)
Neutrophils Relative %: 44 %
Platelet Count: 102 K/uL — ABNORMAL LOW (ref 150–400)
RBC: 3.15 MIL/uL — ABNORMAL LOW (ref 4.22–5.81)
RDW: 15.1 % (ref 11.5–15.5)
Smear Review: NORMAL
WBC Count: 2.4 K/uL — ABNORMAL LOW (ref 4.0–10.5)
nRBC: 0 % (ref 0.0–0.2)

## 2024-01-17 NOTE — Progress Notes (Unsigned)
 " [facility] at [facility] [address] [place],  [place]  [address] [phone]  Clinic Day:  01/17/2024  Referring physician: Keren Vicenta BRAVO, MD   HISTORY OF PRESENT ILLNESS:  The patient is a 89 y.o. male with pancytopenia.  As he was borderline B12 deficient, the patient has been taking monthly B12 injections to fortify his cobalamin stores.  He comes in today to reassess these parameters.  Since his last visit, the patient has been doing fairly well.  He denies having increased fatigue or other systemic symptoms which concern him for worsening pancytopenia.  PHYSICAL EXAM:  Blood pressure 134/65, pulse 80, temperature 97.8 F (36.6 C), temperature source Oral, resp. rate 16, height 6' (1.829 m), weight 148 lb 9.6 oz (67.4 kg), SpO2 99%. Wt Readings from Last 3 Encounters:  01/17/24 148 lb 9.6 oz (67.4 kg)  10/06/23 147 lb (66.7 kg)  09/12/23 144 lb 9.6 oz (65.6 kg)   Body mass index is 20.15 kg/m. Performance status (ECOG): 1 - Symptomatic but completely ambulatory Physical Exam Constitutional:      Appearance: Normal appearance. He is not ill-appearing.     Comments: A pleasant older gentleman in no acute distress.    HENT:     Mouth/Throat:     Mouth: Mucous membranes are moist.     Pharynx: Oropharynx is clear. No oropharyngeal exudate or posterior oropharyngeal erythema.  Cardiovascular:     Rate and Rhythm: Normal rate and regular rhythm.     Heart sounds: No murmur heard.    No friction rub. No gallop.  Pulmonary:     Effort: Pulmonary effort is normal. No respiratory distress.     Breath sounds: Normal breath sounds. No wheezing, rhonchi or rales.  Abdominal:     General: Bowel sounds are normal. There is no distension.     Palpations: Abdomen is soft. There is no mass.     Tenderness: There is no abdominal tenderness.  Musculoskeletal:        General: No swelling.     Right lower leg: No edema.     Left lower leg: No edema.   Lymphadenopathy:     Cervical: No cervical adenopathy.     Upper Body:     Right upper body: No supraclavicular or axillary adenopathy.     Left upper body: No supraclavicular or axillary adenopathy.     Lower Body: No right inguinal adenopathy. No left inguinal adenopathy.  Skin:    General: Skin is warm.     Coloration: Skin is not jaundiced.     Findings: No lesion or rash.  Neurological:     General: No focal deficit present.     Mental Status: He is alert and oriented to person, place, and time. Mental status is at baseline.  Psychiatric:        Mood and Affect: Mood normal.        Behavior: Behavior normal.        Thought Content: Thought content normal.    LABS:      Latest Ref Rng & Units 01/17/2024    1:57 PM 10/06/2023    9:33 AM 08/05/2023    9:23 AM  CBC  WBC 4.0 - 10.5 K/uL 2.4  2.2  2.3   Hemoglobin 13.0 - 17.0 g/dL 89.8  89.4  9.4   Hematocrit 39.0 - 52.0 % 31.9  32.1  29.6   Platelets 150 - 400 K/uL 102  109  109  Latest Ref Rng & Units 10/06/2023    9:33 AM 08/05/2023    9:23 AM 07/15/2023    3:22 AM  CMP  Glucose 70 - 99 mg/dL 84  873  837   BUN 8 - 23 mg/dL 20  25  23    Creatinine 0.61 - 1.24 mg/dL 8.76  8.45  8.84   Sodium 135 - 145 mmol/L 139  139  134   Potassium 3.5 - 5.1 mmol/L 4.1  4.2  4.6   Chloride 98 - 111 mmol/L 104  104  100   CO2 22 - 32 mmol/L 25  25  26    Calcium 8.9 - 10.3 mg/dL 9.0  9.3  8.9   Total Protein 6.5 - 8.1 g/dL 6.6  6.5    Total Bilirubin 0.0 - 1.2 mg/dL 0.4  0.5    Alkaline Phos 38 - 126 U/L 113  144    AST 15 - 41 U/L 18  19    ALT 0 - 44 U/L 7  6     ASSESSMENT & PLAN:  Assessment/Plan:  A 89 y.o. male with pancytopenia.  With comparing his labs today to what they were 2 months ago, his peripheral counts are essentially unchanged.  He knows to continue taking his B12 injections on a monthly basis to ensure adequate fortification of his cobalamin stores.  I will see him back in 4 months for repeat clinical  assessment.  The patient understands that a future bone marrow biopsy is still possible, particularly if his counts abruptly fall over time.  The patient understands all the plans discussed today and is in agreement with them.     Olukemi Panchal DELENA Kerns, MD       "

## 2024-01-30 ENCOUNTER — Ambulatory Visit

## 2024-01-30 VITALS — BP 128/62 | HR 71 | Ht 72.0 in | Wt 147.1 lb

## 2024-01-30 DIAGNOSIS — Z01810 Encounter for preprocedural cardiovascular examination: Secondary | ICD-10-CM | POA: Insufficient documentation

## 2024-01-30 DIAGNOSIS — I480 Paroxysmal atrial fibrillation: Secondary | ICD-10-CM | POA: Diagnosis not present

## 2024-01-30 NOTE — Assessment & Plan Note (Signed)
 Good functional status at baseline walking over a mile consistently for exercise until about a month ago. No active cardiac symptoms. No active signs or symptoms of angina or heart failure.  EKG today in the office reassuring. He did tolerate recent right hip surgery in June 2025 without any significant perioperative complications.  From cardiac standpoint okay to proceed with his elective inguinal hernia surgery as being planned.  Okay to hold Xarelto  as needed 2 to 3 days prior to the surgery. Similarly okay to hold diltiazem  as needed for his surgery to avoid perioperative hypotension.  Continue routine follow-up with Dr. Monetta, his regular cardiologist in 3 months.

## 2024-01-30 NOTE — Progress Notes (Signed)
 "  Cardiology Consultation:    Date:  01/30/2024   ID:  Jason Mcconnell, DOB 26-Aug-1930, MRN [id number]  PCP:  Jason Vicenta BRAVO, MD  Cardiologist:  Jason SAUNDERS Audrie Kuri, MD   Referring MD: Jason Vicenta BRAVO, MD   No chief complaint on file.    ASSESSMENT AND PLAN:   [name] 89 year old male with history of paroxysmal atrial fibrillation and remains on anticoagulation with Xarelto , SVT, symptomatic PVCs, hyperlipidemia, coronary atherosclerosis on CT chest, pancytopenia [follows up with Jason Mcconnell with relatively stable CBC levels on blood work 01/17/2024], vitamin B12 deficiency, CKD stage III. No prior history of CAD, CHF, MI, CVA. Has been an avid biker all his wife, biking multiple miles.  More recently has been walking consistently.  Here for preop risk assessment prior to bilateral inguinal hernia surgery tentatively scheduled January 16 with Dr. Delphia.  Problem List Items Addressed This Visit       Cardiovascular and Mediastinum   Atrial fibrillation (HCC)   Paroxysmal A-fib. Remains in sinus rhythm today. CHA2DS2-VASc score 3 [age and coronary atherosclerosis].  Remains on anticoagulation with rivaroxaban  20 mg once daily. Tolerating well. No side effects.  Okay to hold rivaroxaban  as needed for his upcoming elective noncardiac surgery.  He remains on low-dose diltiazem  which was prescribed as 30 mg 2 times daily, but he has been taking it 1 time a day in the morning. Okay to hold diltiazem  perioperatively as needed.          Other   Preop cardiovascular exam - Primary   Good functional status at baseline walking over a mile consistently for exercise until about a month ago. No active cardiac symptoms. No active signs or symptoms of angina or heart failure.  EKG today in the office reassuring. He did tolerate recent right hip surgery in June 2025 without any significant perioperative complications.  From cardiac standpoint okay to  proceed with his elective inguinal hernia surgery as being planned.  Okay to hold Xarelto  as needed 2 to 3 days prior to the surgery. Similarly okay to hold diltiazem  as needed for his surgery to avoid perioperative hypotension.  Continue routine follow-up with Jason Mcconnell, his regular cardiologist in 3 months.       Relevant Orders   EKG 12-Lead (Completed)      History of Present Illness:    Jason Mcconnell is a 89 y.o. male who is being seen today for follow-up visit. PCP is Jason Vicenta BRAVO, MD. Last visit at our office was with Jason Mcconnell 09/12/2023.  Here for visit for preop assessment prior to bilateral inguinal hernia surgery tentatively scheduled with Dr. Allana for January 16, Friday this week.  Has history of paroxysmal atrial fibrillation and remains on anticoagulation with Xarelto , SVT, symptomatic PVCs, hyperlipidemia, coronary atherosclerosis on CT chest, pancytopenia [follows up with Jason Mcconnell with relatively stable CBC levels on blood work 01/17/2024], vitamin B12 deficiency, CKD stage III. No prior history of CAD, CHF, MI, CVA. Has been an avid biker all his wife, biking multiple miles.  More recently has been walking consistently.  Pleasant man here for the visit today by himself. Mentions is in good state of health. Until about a month ago he was able to walk up to a mile 3-4 times a week without any significant limitation. Has been noticing mild discomfort in the groin and underwent evaluation and now awaiting surgery.  Denies any chest pain, shortness of breath, orthopnea, paroxysmal nocturnal dyspnea. Denies any significant palpitations  or lightheadedness or dizziness. Reports last episode of palpitations from paroxysmal A-fib was over 5 to 6 months ago.  He underwent right hip surgery June 2025 after sustaining hip fracture from a mechanical fall.  Did well perioperatively without any significant cardiac symptoms.  Continues to take  rivaroxaban  20 mg consistently. Takes diltiazem  30 mg 1 time in the morning.  EKG in the clinic today shows sinus rhythm heart rate 71/min, PR interval 208 ms, QRS duration 86 ms, QTc 410 ms no ischemic changes.  Past Medical History:  Diagnosis Date   Arthritis    Atrial fibrillation (HCC)    a. Dx 08/02/18 at [facility]   Back pain    Closed right hip fracture, initial encounter [facility]) 07/11/2023   Coronary artery calcification seen on CT scan    EPIDIDYMAL CYST 12/19/2006   Qualifier: Diagnosis of  By: Jason Mcconnell    Fracture of distal end of radius 11/12/2017   GERD (gastroesophageal reflux disease)    Ingrown nail 07/24/2015   Leukopenia 09/06/2021   Other malaise and fatigue 09/13/2007   Qualifier: Diagnosis of  By: Jason Mcconnell    Pain in soft tissues of limb 07/24/2015   Pain of right hip joint 07/07/2023   Pancytopenia (HCC) 09/06/2021   Wrist pain 08/21/2018    Past Surgical History:  Procedure Laterality Date   CARDIAC CATHETERIZATION Bilateral    CATARACT EXTRACTION Bilateral    CHOLECYSTECTOMY     INTRAMEDULLARY (IM) NAIL INTERTROCHANTERIC Right 07/14/2023   Procedure: FIXATION, FRACTURE, INTERTROCHANTERIC, WITH INTRAMEDULLARY ROD;  Surgeon: Jason Rogue, MD;  Location: [facility];  Service: Orthopedics;  Laterality: Right;   MENISCUS REPAIR Right    POLYPECTOMY     right wrist frac     UMBILICAL HERNIA REPAIR      Current Medications: Active Medications[1]   Allergies:   Patient has no known allergies.   Social History   Socioeconomic History   Marital status: Married    Spouse name: Jason Mcconnell    Number of children: 3   Years of education: 12   Highest education level: Not on file  Occupational History   Occupation: RETIRED TRUCK DRIVER    Comment: CUR[employer]LY [employer]  Tobacco Use   Smoking status: Former    Current packs/day: 0.00    Types: Cigarettes    Quit date: 08/20/1988    Years since quitting: 35.4    Smokeless tobacco: Former    Types: Associate Professor status: Never Used  Substance and Sexual Activity   Alcohol  use: Yes    Comment: Very Seldom   Drug use: Never   Sexual activity: Not Currently  Other Topics Concern   Not on file  Social History Narrative   Not on file   Social Drivers of Health   Tobacco Use: Medium Risk (01/30/2024)   Patient History    Smoking Tobacco Use: Former    Smokeless Tobacco Use: Former    Passive Exposure: Not on Actuary Strain: Not on file  Food Insecurity: Low Risk (12/28/2023)   Received from [facility]   Epic    Within the past 12 months, you worried that your food would run out before you got money to buy more: Never true    Within the past 12 months, the food you bought just didn't last and you didn't have money to get more. : Never true  Transportation Needs: No Transportation Needs (  12/28/2023)   Received from [facility]    In the past 12 months, has lack of reliable transportation kept you from medical appointments, meetings, work or from getting things needed for daily living? : No  Physical Activity: Not on file  Stress: Not on file  Social Connections: Socially Integrated (07/11/2023)   Social Connection and Isolation Panel    Frequency of Communication with Friends and Family: Three times a week    Frequency of Social Gatherings with Friends and Family: Twice a week    Attends Religious Services: More than 4 times per year    Active Member of Clubs or Organizations: Yes    Attends Banker Meetings: More than 4 times per year    Marital Status: Married  Depression (PHQ2-9): Low Risk (10/06/2023)   Depression (PHQ2-9)    PHQ-2 Score: 0  Alcohol  Screen: Not on file  Housing: Low Risk (12/28/2023)   Received from [facility]   Epic    What is your living situation today?: I have a steady place to live    Think about the place you live. Do you have problems with  any of the following? Choose all that apply:: None/None on this list  Utilities: Low Risk (12/28/2023)   Received from [facility]   Utilities    In the past 12 months has the electric, gas, oil, or water  company threatened to shut off services in your home? : No  Health Literacy: Not on file     Family History: The patient's family history includes Alcohol  abuse in his father; COPD in his mother; Heart failure in his father; Ovarian cancer in his maternal grandmother. ROS:   Please see the history of present illness.    All 14 point review of systems negative except as described per history of present illness.  EKGs/Labs/Other Studies Reviewed:    The following studies were reviewed today:   EKG:  EKG Interpretation Date/Time:  Monday January 30 2024 16:01:10 EST Ventricular Rate:  71 PR Interval:  208 QRS Duration:  86 QT Interval:  378 QTC Calculation: 410 R Axis:   64  Text Interpretation: Normal sinus rhythm Normal ECG When compared with ECG of 11-Jul-2023 13:18, No significant change was found Confirmed by Liborio Hai reddy [phone]) on 01/30/2024 4:13:46 PM    Recent Labs: 10/06/2023: ALT 7; BUN 20; Creatinine 1.23; Potassium 4.1; Sodium 139 01/17/2024: Hemoglobin 10.1; Platelet Count 102  Recent Lipid Panel    Component Value Date/Time   CHOL 113 10/01/2022 0826   TRIG 54 10/01/2022 0826   HDL 44 10/01/2022 0826   CHOLHDL 2.6 10/01/2022 0826   CHOLHDL 3 05/12/2021 1344   VLDL 14.2 05/12/2021 1344   LDLCALC 57 10/01/2022 0826    Physical Exam:    VS:  BP 128/62   Pulse 71   Ht 6' (1.829 m)   Wt 147 lb 2 oz (66.7 kg)   SpO2 100%   BMI 19.95 kg/m     Wt Readings from Last 3 Encounters:  01/30/24 147 lb 2 oz (66.7 kg)  01/17/24 148 lb 9.6 oz (67.4 kg)  10/06/23 147 lb (66.7 kg)     GENERAL:  Well nourished, well developed in no acute distress NECK: No JVD; No carotid bruits CARDIAC: RRR, S1 and S2 present, no murmurs, no rubs, no  gallops CHEST:  Clear to auscultation without rales, wheezing or rhonchi  Extremities: No pitting pedal edema. Pulses bilaterally symmetric with radial 2+  and dorsalis pedis 2+ NEUROLOGIC:  Alert and oriented x 3  Medication Adjustments/Labs and Tests Ordered: Current medicines are reviewed at length with the patient today.  Concerns regarding medicines are outlined above.  Orders Placed This Encounter  Procedures   EKG 12-Lead   No orders of the defined types were placed in this encounter.   Signed, Jason reddy Safiyya Stokes, MD, MPH, [facility]. 01/30/2024 5:26 PM    [facility]    [1]  Current Meds  Medication Sig   clotrimazole-betamethasone (LOTRISONE) cream Apply 1 Application topically 2 (two) times daily.   cyanocobalamin  (VITAMIN B12) 1000 MCG/ML injection 1000 mcg monthly   diltiazem  (CARDIZEM ) 30 MG tablet Take 1 tablet (30 mg total) by mouth 2 (two) times daily.   diphenhydrAMINE (BENADRYL ALLERGY) 25 MG tablet Take 25-50 mg by mouth every 6 (six) hours as needed for itching or allergies.   magnesium  hydroxide (MILK OF MAGNESIA) 400 MG/5ML suspension Take 30 mLs by mouth daily as needed for mild constipation.   rivaroxaban  (XARELTO ) 20 MG TABS tablet Take 1 tablet (20 mg total) by mouth daily with supper.   "

## 2024-01-30 NOTE — Patient Instructions (Signed)
 Medication Instructions:  Your physician recommends that you continue on your current medications as directed. Please refer to the Current Medication list given to you today.  *If you need a refill on your cardiac medications before your next appointment, please call your pharmacy*  Lab Work: None If you have labs (blood work) drawn today and your tests are completely normal, you will receive your results only by: MyChart Message (if you have MyChart) OR A paper copy in the mail If you have any lab test that is abnormal or we need to change your treatment, we will call you to review the results.  Testing/Procedures: None  Follow-Up: At [facility], you and your health needs are our priority.  As part of our continuing mission to provide you with exceptional heart care, our providers are all part of one team.  This team includes your primary Cardiologist (physician) and Advanced Practice Providers or APPs (Physician Assistants and Nurse Practitioners) who all work together to provide you with the care you need, when you need it.  Your next appointment:   3 month(s)  Provider:   Redell Leiter, MD    We recommend signing up for the patient portal called MyChart.  Sign up information is provided on this After Visit Summary.  MyChart is used to connect with patients for Virtual Visits (Telemedicine).  Patients are able to view lab/test results, encounter notes, upcoming appointments, etc.  Non-urgent messages can be sent to your provider as well.   To learn more about what you can do with MyChart, go to [email or web address].   Other Instructions  Ok to proceed with planned surgery.  Hold Xarelto  for 3 days before planned surgery.

## 2024-01-30 NOTE — Assessment & Plan Note (Signed)
 Paroxysmal A-fib. Remains in sinus rhythm today. CHA2DS2-VASc score 3 [age and coronary atherosclerosis].  Remains on anticoagulation with rivaroxaban  20 mg once daily. Tolerating well. No side effects.  Okay to hold rivaroxaban  as needed for his upcoming elective noncardiac surgery.  He remains on low-dose diltiazem  which was prescribed as 30 mg 2 times daily, but he has been taking it 1 time a day in the morning. Okay to hold diltiazem  perioperatively as needed.

## 2024-02-06 ENCOUNTER — Ambulatory Visit: Admitting: Oncology

## 2024-02-06 ENCOUNTER — Other Ambulatory Visit

## 2024-02-22 ENCOUNTER — Encounter (INDEPENDENT_AMBULATORY_CARE_PROVIDER_SITE_OTHER): Admitting: Ophthalmology

## 2024-02-22 DIAGNOSIS — H43813 Vitreous degeneration, bilateral: Secondary | ICD-10-CM

## 2024-02-22 DIAGNOSIS — H353132 Nonexudative age-related macular degeneration, bilateral, intermediate dry stage: Secondary | ICD-10-CM | POA: Diagnosis not present

## 2024-02-22 DIAGNOSIS — H34812 Central retinal vein occlusion, left eye, with macular edema: Secondary | ICD-10-CM

## 2024-02-28 ENCOUNTER — Inpatient Hospital Stay

## 2024-02-28 ENCOUNTER — Inpatient Hospital Stay: Admitting: Oncology

## 2024-05-07 ENCOUNTER — Ambulatory Visit: Admitting: Cardiology

## 2024-05-16 ENCOUNTER — Inpatient Hospital Stay

## 2024-05-16 ENCOUNTER — Inpatient Hospital Stay: Admitting: Oncology

## 2024-07-11 ENCOUNTER — Encounter (INDEPENDENT_AMBULATORY_CARE_PROVIDER_SITE_OTHER): Admitting: Ophthalmology
